# Patient Record
Sex: Male | Born: 1960
Health system: Southern US, Community
[De-identification: ages and names within clinical notes are randomized; demographics above are authoritative.]

## PROBLEM LIST (undated history)

## (undated) ENCOUNTER — Emergency Department (HOSPITAL_COMMUNITY): Disposition: A | Discharge status: Home / Self Care | Payer: 59

## (undated) DIAGNOSIS — G4730 Sleep apnea, unspecified: Secondary | ICD-10-CM

## (undated) DIAGNOSIS — I10 Essential (primary) hypertension: Secondary | ICD-10-CM

## (undated) DIAGNOSIS — Z9289 Personal history of other medical treatment: Secondary | ICD-10-CM

## (undated) DIAGNOSIS — Z8489 Family history of other specified conditions: Secondary | ICD-10-CM

## (undated) DIAGNOSIS — I2510 Atherosclerotic heart disease of native coronary artery without angina pectoris: Secondary | ICD-10-CM

## (undated) DIAGNOSIS — E785 Hyperlipidemia, unspecified: Secondary | ICD-10-CM

## (undated) DIAGNOSIS — N200 Calculus of kidney: Secondary | ICD-10-CM

## (undated) DIAGNOSIS — M1990 Unspecified osteoarthritis, unspecified site: Secondary | ICD-10-CM

## (undated) DIAGNOSIS — Z87442 Personal history of urinary calculi: Secondary | ICD-10-CM

## (undated) HISTORY — DX: Hyperlipidemia, unspecified: E78.5

## (undated) HISTORY — PX: CYSTOSCOPY/RETROGRADE/URETEROSCOPY/STONE EXTRACTION WITH BASKET: SHX5317

## (undated) HISTORY — PX: BACK SURGERY: SHX140

## (undated) HISTORY — DX: Personal history of other medical treatment: Z92.89

## (undated) HISTORY — PX: CARDIAC CATHETERIZATION: SHX172

---
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
## 1997-10-18 ENCOUNTER — Ambulatory Visit (HOSPITAL_COMMUNITY): Admission: RE | Admit: 1997-10-18 | Discharge: 1997-10-18 | Payer: Self-pay | Admitting: Neurosurgery

## 1997-10-25 ENCOUNTER — Ambulatory Visit (HOSPITAL_COMMUNITY): Admission: RE | Admit: 1997-10-25 | Discharge: 1997-10-25 | Payer: Self-pay | Admitting: Neurosurgery

## 1997-12-04 DIAGNOSIS — Z9289 Personal history of other medical treatment: Secondary | ICD-10-CM

## 1997-12-04 HISTORY — DX: Personal history of other medical treatment: Z92.89

## 1998-01-09 ENCOUNTER — Ambulatory Visit (HOSPITAL_COMMUNITY): Admission: RE | Admit: 1998-01-09 | Discharge: 1998-01-09 | Payer: Self-pay | Admitting: Neurosurgery

## 1998-05-07 ENCOUNTER — Encounter: Payer: Self-pay | Admitting: Emergency Medicine

## 1998-05-07 ENCOUNTER — Inpatient Hospital Stay (HOSPITAL_COMMUNITY): Admission: EM | Admit: 1998-05-07 | Discharge: 1998-05-08 | Payer: Self-pay | Admitting: Emergency Medicine

## 1998-05-27 ENCOUNTER — Encounter: Payer: Self-pay | Admitting: Gastroenterology

## 1998-05-27 ENCOUNTER — Ambulatory Visit (HOSPITAL_COMMUNITY): Admission: RE | Admit: 1998-05-27 | Discharge: 1998-05-27 | Payer: Self-pay | Admitting: Gastroenterology

## 1998-06-09 ENCOUNTER — Ambulatory Visit (HOSPITAL_COMMUNITY): Admission: RE | Admit: 1998-06-09 | Discharge: 1998-06-09 | Payer: Self-pay | Admitting: Gastroenterology

## 1998-11-07 ENCOUNTER — Ambulatory Visit (HOSPITAL_COMMUNITY): Admission: RE | Admit: 1998-11-07 | Discharge: 1998-11-07 | Payer: Self-pay | Admitting: Nephrology

## 1998-11-07 ENCOUNTER — Encounter: Payer: Self-pay | Admitting: Nephrology

## 1998-12-12 ENCOUNTER — Ambulatory Visit (HOSPITAL_COMMUNITY): Admission: RE | Admit: 1998-12-12 | Discharge: 1998-12-12 | Payer: Self-pay | Admitting: Cardiology

## 1999-01-17 ENCOUNTER — Ambulatory Visit: Admission: RE | Admit: 1999-01-17 | Discharge: 1999-01-17 | Payer: Self-pay | Admitting: Internal Medicine

## 1999-02-03 ENCOUNTER — Emergency Department (HOSPITAL_COMMUNITY): Admission: EM | Admit: 1999-02-03 | Discharge: 1999-02-03 | Payer: Self-pay | Admitting: Emergency Medicine

## 1999-02-03 ENCOUNTER — Encounter: Payer: Self-pay | Admitting: Emergency Medicine

## 1999-12-10 ENCOUNTER — Emergency Department (HOSPITAL_COMMUNITY): Admission: EM | Admit: 1999-12-10 | Discharge: 1999-12-10 | Payer: Self-pay | Admitting: Emergency Medicine

## 1999-12-10 ENCOUNTER — Encounter: Payer: Self-pay | Admitting: Cardiology

## 2000-01-29 ENCOUNTER — Ambulatory Visit (HOSPITAL_COMMUNITY): Admission: RE | Admit: 2000-01-29 | Discharge: 2000-01-29 | Payer: Self-pay | Admitting: Neurosurgery

## 2000-01-29 ENCOUNTER — Encounter: Payer: Self-pay | Admitting: Neurosurgery

## 2000-05-30 ENCOUNTER — Encounter: Payer: Self-pay | Admitting: Emergency Medicine

## 2000-05-30 ENCOUNTER — Emergency Department (HOSPITAL_COMMUNITY): Admission: EM | Admit: 2000-05-30 | Discharge: 2000-05-30 | Payer: Self-pay | Admitting: Emergency Medicine

## 2000-05-31 ENCOUNTER — Encounter: Admission: RE | Admit: 2000-05-31 | Discharge: 2000-05-31 | Payer: Self-pay | Admitting: Urology

## 2000-05-31 ENCOUNTER — Encounter: Payer: Self-pay | Admitting: Urology

## 2000-06-03 ENCOUNTER — Encounter: Payer: Self-pay | Admitting: Emergency Medicine

## 2000-06-03 ENCOUNTER — Emergency Department (HOSPITAL_COMMUNITY): Admission: EM | Admit: 2000-06-03 | Discharge: 2000-06-03 | Payer: Self-pay | Admitting: Emergency Medicine

## 2000-06-05 ENCOUNTER — Encounter: Payer: Self-pay | Admitting: Urology

## 2000-06-05 ENCOUNTER — Emergency Department (HOSPITAL_COMMUNITY): Admission: EM | Admit: 2000-06-05 | Discharge: 2000-06-05 | Payer: Self-pay | Admitting: Emergency Medicine

## 2000-06-07 ENCOUNTER — Emergency Department (HOSPITAL_COMMUNITY): Admission: EM | Admit: 2000-06-07 | Discharge: 2000-06-07 | Payer: Self-pay | Admitting: Emergency Medicine

## 2000-06-09 ENCOUNTER — Emergency Department (HOSPITAL_COMMUNITY): Admission: EM | Admit: 2000-06-09 | Discharge: 2000-06-10 | Payer: Self-pay | Admitting: Emergency Medicine

## 2000-07-05 HISTORY — PX: ORIF FINGER / THUMB FRACTURE: SUR932

## 2000-07-11 ENCOUNTER — Ambulatory Visit (HOSPITAL_COMMUNITY): Admission: RE | Admit: 2000-07-11 | Discharge: 2000-07-11 | Payer: Self-pay | Admitting: Urology

## 2000-07-11 ENCOUNTER — Encounter: Payer: Self-pay | Admitting: Urology

## 2000-07-13 ENCOUNTER — Encounter: Payer: Self-pay | Admitting: Urology

## 2000-07-13 ENCOUNTER — Encounter: Payer: Self-pay | Admitting: Emergency Medicine

## 2000-07-13 ENCOUNTER — Ambulatory Visit (HOSPITAL_COMMUNITY): Admission: EM | Admit: 2000-07-13 | Discharge: 2000-07-13 | Payer: Self-pay | Admitting: Emergency Medicine

## 2000-08-01 ENCOUNTER — Encounter: Payer: Self-pay | Admitting: Urology

## 2000-08-01 ENCOUNTER — Encounter: Admission: RE | Admit: 2000-08-01 | Discharge: 2000-08-01 | Payer: Self-pay | Admitting: Urology

## 2002-08-14 ENCOUNTER — Emergency Department (HOSPITAL_COMMUNITY): Admission: EM | Admit: 2002-08-14 | Discharge: 2002-08-14 | Payer: Self-pay | Admitting: Emergency Medicine

## 2002-08-14 ENCOUNTER — Encounter: Payer: Self-pay | Admitting: Emergency Medicine

## 2004-04-21 ENCOUNTER — Emergency Department (HOSPITAL_COMMUNITY): Admission: EM | Admit: 2004-04-21 | Discharge: 2004-04-21 | Payer: Self-pay | Admitting: Emergency Medicine

## 2004-07-05 HISTORY — PX: KIDNEY STONE SURGERY: SHX686

## 2005-06-28 ENCOUNTER — Emergency Department (HOSPITAL_COMMUNITY): Admission: EM | Admit: 2005-06-28 | Discharge: 2005-06-28 | Payer: Self-pay | Admitting: *Deleted

## 2005-07-10 ENCOUNTER — Emergency Department (HOSPITAL_COMMUNITY): Admission: EM | Admit: 2005-07-10 | Discharge: 2005-07-10 | Payer: Self-pay | Admitting: Emergency Medicine

## 2007-01-03 HISTORY — PX: SHOULDER ARTHROSCOPY W/ SUBACROMIAL DECOMPRESSION AND DISTAL CLAVICLE EXCISION: SHX2401

## 2007-01-17 ENCOUNTER — Observation Stay (HOSPITAL_COMMUNITY): Admission: EM | Admit: 2007-01-17 | Discharge: 2007-01-18 | Payer: Self-pay | Admitting: Orthopedic Surgery

## 2007-01-17 ENCOUNTER — Encounter: Payer: Self-pay | Admitting: Orthopedic Surgery

## 2007-01-23 ENCOUNTER — Encounter: Admission: RE | Admit: 2007-01-23 | Discharge: 2007-04-23 | Payer: Self-pay | Admitting: Orthopedic Surgery

## 2007-11-22 ENCOUNTER — Emergency Department (HOSPITAL_COMMUNITY): Admission: EM | Admit: 2007-11-22 | Discharge: 2007-11-22 | Payer: Self-pay | Admitting: Emergency Medicine

## 2009-05-20 DIAGNOSIS — Z9289 Personal history of other medical treatment: Secondary | ICD-10-CM

## 2009-05-20 HISTORY — DX: Personal history of other medical treatment: Z92.89

## 2009-06-02 ENCOUNTER — Emergency Department (HOSPITAL_BASED_OUTPATIENT_CLINIC_OR_DEPARTMENT_OTHER): Admission: EM | Admit: 2009-06-02 | Discharge: 2009-06-02 | Payer: Self-pay | Admitting: Emergency Medicine

## 2009-06-02 ENCOUNTER — Ambulatory Visit: Payer: Self-pay | Admitting: Diagnostic Radiology

## 2009-10-23 ENCOUNTER — Emergency Department (HOSPITAL_BASED_OUTPATIENT_CLINIC_OR_DEPARTMENT_OTHER): Admission: EM | Admit: 2009-10-23 | Discharge: 2009-10-23 | Payer: Self-pay | Admitting: Emergency Medicine

## 2010-06-17 ENCOUNTER — Encounter
Admission: RE | Admit: 2010-06-17 | Discharge: 2010-06-17 | Payer: Self-pay | Source: Home / Self Care | Attending: Neurology | Admitting: Neurology

## 2010-10-07 LAB — URINE CULTURE
Colony Count: NO GROWTH
Culture: NO GROWTH

## 2010-10-07 LAB — URINE MICROSCOPIC-ADD ON

## 2010-10-07 LAB — CBC
HCT: 44.6 % (ref 39.0–52.0)
Hemoglobin: 15 g/dL (ref 13.0–17.0)
MCHC: 33.7 g/dL (ref 30.0–36.0)
MCV: 89.2 fL (ref 78.0–100.0)
Platelets: 218 10*3/uL (ref 150–400)
RBC: 5 MIL/uL (ref 4.22–5.81)
RDW: 13 % (ref 11.5–15.5)
WBC: 6.3 K/uL (ref 4.0–10.5)

## 2010-10-07 LAB — URINALYSIS, ROUTINE W REFLEX MICROSCOPIC
Bilirubin Urine: NEGATIVE
Glucose, UA: NEGATIVE mg/dL
Ketones, ur: 15 mg/dL — AB
Nitrite: NEGATIVE
Protein, ur: NEGATIVE mg/dL
Specific Gravity, Urine: 1.016 (ref 1.005–1.030)
Urobilinogen, UA: 0.2 mg/dL (ref 0.0–1.0)
pH: 5.5 (ref 5.0–8.0)

## 2010-10-07 LAB — DIFFERENTIAL
Basophils Absolute: 0.1 K/uL (ref 0.0–0.1)
Basophils Relative: 2 % — ABNORMAL HIGH (ref 0–1)
Eosinophils Absolute: 0.2 10*3/uL (ref 0.0–0.7)
Eosinophils Relative: 3 % (ref 0–5)
Lymphocytes Relative: 36 % (ref 12–46)
Lymphs Abs: 2.3 K/uL (ref 0.7–4.0)
Monocytes Absolute: 0.6 10*3/uL (ref 0.1–1.0)
Monocytes Relative: 9 % (ref 3–12)
Neutro Abs: 3.1 K/uL (ref 1.7–7.7)
Neutrophils Relative %: 51 % (ref 43–77)

## 2010-10-07 LAB — COMPREHENSIVE METABOLIC PANEL
ALT: 33 U/L (ref 0–53)
Albumin: 4.4 g/dL (ref 3.5–5.2)
Alkaline Phosphatase: 100 U/L (ref 39–117)
Glucose, Bld: 98 mg/dL (ref 70–99)
Potassium: 5 mEq/L (ref 3.5–5.1)
Sodium: 143 mEq/L (ref 135–145)
Total Protein: 7.4 g/dL (ref 6.0–8.3)

## 2010-10-07 LAB — COMPREHENSIVE METABOLIC PANEL WITH GFR
AST: 45 U/L — ABNORMAL HIGH (ref 0–37)
BUN: 17 mg/dL (ref 6–23)
CO2: 22 meq/L (ref 19–32)
Calcium: 9.1 mg/dL (ref 8.4–10.5)
Chloride: 109 meq/L (ref 96–112)
Creatinine, Ser: 0.9 mg/dL (ref 0.4–1.5)
GFR calc Af Amer: >60 mL/min (ref >60)
GFR calc non Af Amer: >60 mL/min (ref >60)
Total Bilirubin: 0.8 mg/dL (ref 0.3–1.2)

## 2010-10-07 LAB — LIPASE, BLOOD: Lipase: 99 U/L (ref 23–300)

## 2010-11-17 NOTE — Op Note (Signed)
NAME:  Steven Henson, Steven Henson              ACCOUNT NO.:  [id number]   MEDICAL RECORD NO.:  [id number]          PATIENT TYPE:  AMB   LOCATION:  DAY                          FACILITY:  [facility]   PHYSICIAN:  Dionne Ano. Gramig III, M.D.DATE OF BIRTH:  06/01/1961   DATE OF PROCEDURE:  01/17/2007  DATE OF DISCHARGE:                               OPERATIVE REPORT   PREOPERATIVE DIAGNOSES:  1. Impingement syndrome left shoulder, rule out rotator cuff tear with      notable type 2 acromion morphology and mild degenerative changes      about the acromioclavicular joint and notable failure of      conservative management of the condition.  2. Ulnar nerve compression at the elbow.   SURGICAL PROCEDURE PERFORMED:  1. Evaluation under anesthesia, left shoulder.  2. Arthroscopy left shoulder with subacromial decompression and      bursectomy.  3. Arthroscopic distal clavicle resection.  4. Arthroscopy left shoulder with glenohumeral joint evaluation and      notable intact rotator cuff.  5. Ulnar nerve release at the elbow (in situ).   SURGEON:  Dionne Ano. Amanda Pea, M.D.   ASSISTANT:  Karie Chimera, P.A.-C.   COMPLICATIONS:  None.   ANESTHESIA:  General.   TOURNIQUET TIME:  Less than 40 minutes for the ulnar nerve release.   INDICATIONS FOR PROCEDURE:  This patient is a pleasant male presents  above-mentioned diagnosis.  He has failed conservative management and  desires to proceed with the above-mentioned operative intervention.  He  understands the risks of bleeding, infection, anesthesia, stiffness,  dystrophy and other various imponderables associated with the procedure  itself which had been extensively detailed to him.   OPERATION IN DETAIL:  The patient seen by myself and anesthesia, Ancef  was given preoperatively.  The patient was consented, permit was signed.  Arm marked and he was taken to the operative suite and underwent general  anesthesia under direction of Dr. Council Mechanic.  After  smooth induction he  was placed a beach-chair position.   Following this glenohumeral evaluation and evaluation under anesthesia  revealed excellent stability about shoulder.  There was no gross  insufficiency of the anterior and posterior labral regions noted on  clinical exam.  There was no crepitus about the glenohumeral joint with  shift and load test.   Following this sterile prep and drape ensued.  Once sterile field was  secured posterolateral portal was made followed by an anterior and  lateral portal during the course of the operation.  The glenohumeral  joint was entered and following this, the biceps tendon and its anchor  looked excellent.  The anterior posterior labrum appeared to be normal.  There was no loose bodies in the glenohumeral joint.  The articular  surface looked excellent and the rotator cuff did not have any  significant tearing.  This area was thoroughly evaluated and  arthroscopic pictures were taken for permanent documentation.   Following this, I then performed placement of the scope and subacromial  space and performed a subacromial decompression and bursectomy.  I  should note the patient had a very  robust and adherent bursa.  This took  a good deal of time to take down and remove.  This done so without  difficulty, removing adhesions.  I did perform release of the  coracoacromial ligament as it was very dense adherent and somewhat  impinging against the rotator cuff interval region.  Following this  shaver and bur were used to perform the subacromial decompression and  convert him to a type 1 acromion morphology.   Following this, I then placed the scope more medial, identified the [facility]  joint, performed arthroscopic distal clavicle resection with bur shaver  and thermal ablator.   This was done through a working anterior portal.   Thus, the patient underwent subacromial decompression, bursectomy and  distal clavicle excision through the scope.  He  tolerated this well and  no complicating features.   Following this, I closed the wounds with Prolene, placed sterile bandage  and then placed him in a supine position.  The arm was then padded,  tourniquet was applied and insufflated after thorough prep and drape.  Once this was done, the patient underwent incision posterior medially.  Dissection was carried down.  The patient underwent release of the  medial intermuscular septum, arcade of Struthers, distal portion as well  as the cubital tunnel, Osborne's ligament and a two heads of the FCU  both superficial and deep.  The patient tolerated this well.  Arm was  placed through a full range of motion.  There was no subluxation of the  nerve.  I was pleased with the findings.  Following this, I then  performed a very copious irrigation of the region followed by closure  with Vicryl and Prolene.  The patient tolerated this well and there were  no complicating features.   The patient's family and I have discussed all issues.  Sterile dressing  was applied to the medial elbow.   He will be monitored overnight due to sleep apnea issues.  In addition  to this, I have discussed with he and his family we will begin therapy  next week and see him Friday for wound check.  The patient be given  additional gram of Ancef and the postop recovery care region and will be  monitored with full sleep apnea precautions.  It is a pleasure to  participate in his care and I look forward to participating in his  operative recovery.           ______________________________  Dionne Ano. Everlene Other, M.D.     Nash Mantis  D:  01/17/2007  T:  01/18/2007  Job:  [id number]

## 2010-11-20 NOTE — Op Note (Signed)
[facility]  Patient:    Steven Henson, Steven Henson                     MRN: [id number] Proc. Date: 07/13/00 Adm. Date:  [id number] Disc. Date: [id number] Attending:  Benny Lennert                           Operative Report  PREOPERATIVE DIAGNOSIS:  Left ureteral calculi.  POSTOPERATIVE DIAGNOSIS:  Left ureteral calculi.  OPERATION:  Cystoscopy, ureteroscopy, balloon dilation of the ureter, stone basket and double-J stent placement.  SURGEON:  Barron Alvine, M.D.  ANESTHESIA:  General.  INDICATIONS:  Mr. Steven Henson is a 50 year old male.  He underwent lithotripsy 48 hours ago for an 8-10 mm stone in his left proximal ureter.  He presents with excruciating flank discomfort.  Imaging studies have revealed a group of calculi within the distal ureter causing obstruction.  He presents now for removal of those calculi/fragments.  DESCRIPTION OF PROCEDURE:   The patient was taken to the operating room where he had the successful induction of general anesthesia.  He was placed in the lithotomy position, prepped and draped in the usual manner.  Cystoscopy revealed an unremarkable bladder with the exception of few small stone fragments.  Retrograde pyelogram confirmed the presence of the stones  A guidewire was able to be passed beyond the stone fragments into the renal pelvis with fluoroscopic guidance.  We were able to engage the ureter reasonably easily but as we got up 1-2 cm, the ureter narrowed significantly. We were unable to pass the scope and for that reason we went ahead and performed balloon dilation of the distal ureter in a standard manner with a 4 cm balloon.  At the completion of balloon dilation we were able to advance the ureteroscope up to the fragments.  Multiple left distal ureteral stone fragments were basket extracted.  No additional fragments were appreciated on repeat uteroscopy.  Over the guide wire a 7 French x 27 cm double-J stent was placed  for fluoroscopic guidance.  The patient appeared to tolerate the operation well.  There were no obvious complications. DD:  08/07/00 TD:  08/08/00 Job: [id number]

## 2010-11-20 NOTE — Op Note (Signed)
[place]  Patient:    Steven Henson, Steven Henson                     MRN: [id number] Proc. Date: 06/05/00 Adm. Date:  [id number] Attending:  Lauree Chandler CC:         Thereasa Solo. Little, M.D.   Operative Report  PREOPERATIVE DIAGNOSIS:  Severe right recurrent flank pain, due to right distal ureteral calculus.  POSTOPERATIVE DIAGNOSIS:  Severe right recurrent flank pain, due to right distal ureteral calculus.  PROCEDURE:  Cystoscopy, right ureteroscopy, urethroscopic stone extraction, right retrograde pyelogram with interpretation, and insertion of a right double-J catheter.  SURGEON:  Maretta Bees. Vonita Moss, M.D.  ANESTHESIA:  General.  INDICATIONS:  This 50 year old gentleman has a 4.0 mm stone in the distal right ureter that has caused several days of recurrent right pain, and he wishes to have this stone removed.  DESCRIPTION OF PROCEDURE:  The patient is brought to the operating room and placed in the lithotomy position.  The external genitalia were prepped and draped in the usual fashion.  He was cystoscoped, and the urethra, prostate, and bladder were unremarkable.  I passed a guide wire up under fluoroscopic control, past the distal ureteral stone.  I then did a balloon dilatation with the 4.0 cm balloon dilating unit.  I dilated the intramural ureter for three minutes.  I then used a 6-French short rigid ureteroscope, and with the guide wire in place, encountered a light yellow irregular stone just beyond the intramural ureter.  I then used the [facility] stone basket, and removed this stone intact.  The stone was given to his family.  At this point, a right retrograde pyelogram was performed, showing the nonobstructed pyelocaliceal system.  With contrast in place, I inserted a 6-French 26.0 cm double-J catheter over the guide wire, and the proximal end coiled in the renal pelvis, and the distal end coiled in the bladder, with the string  brought out per the urethra and taped to the penis.  He was taken to the recovery room in good condition, having tolerated the procedure well. DD:  06/05/00 TD:  06/05/00 Job: [id number]

## 2011-04-13 ENCOUNTER — Encounter (INDEPENDENT_AMBULATORY_CARE_PROVIDER_SITE_OTHER): Payer: Self-pay | Admitting: General Surgery

## 2011-04-13 ENCOUNTER — Ambulatory Visit (INDEPENDENT_AMBULATORY_CARE_PROVIDER_SITE_OTHER): Payer: 59 | Admitting: General Surgery

## 2011-04-13 VITALS — BP 142/102 | HR 96 | Temp 97.8°F | Resp 20 | Ht 70.0 in | Wt 218.8 lb

## 2011-04-13 DIAGNOSIS — D171 Benign lipomatous neoplasm of skin and subcutaneous tissue of trunk: Secondary | ICD-10-CM | POA: Insufficient documentation

## 2011-04-13 DIAGNOSIS — D1779 Benign lipomatous neoplasm of other sites: Secondary | ICD-10-CM

## 2011-04-13 NOTE — Progress Notes (Signed)
HPI The patient comes in with a chief complaint of a large growth on the upper portion of his right back. It is felt to be a lipoma.  PE On examination this appears to be a 6 x 7 cm lipoma of the right upper back in the upper portion of the trapezius muscle. Believe that this is supple muscular or underneath the fascia.  Study review There are no studies to review.  Assessment 7 cm lipoma likely subfascial in the right upper back  Plan The plan is to perform an excision under local and/or monitored anesthesia care. This will be an outpatient procedure.

## 2011-04-19 LAB — PROTIME-INR
INR: 1
Prothrombin Time: 12.8

## 2011-04-19 LAB — HEMOGLOBIN AND HEMATOCRIT, BLOOD: Hemoglobin: 14.3

## 2011-05-10 ENCOUNTER — Encounter (HOSPITAL_BASED_OUTPATIENT_CLINIC_OR_DEPARTMENT_OTHER): Payer: Self-pay | Admitting: *Deleted

## 2011-05-10 NOTE — Progress Notes (Signed)
Pt's cardiologist - Dr. Caprice Kluver - sees him regularly every 18 mos.  Office note, EKG requested from office. [phone]

## 2011-05-14 ENCOUNTER — Other Ambulatory Visit (INDEPENDENT_AMBULATORY_CARE_PROVIDER_SITE_OTHER): Payer: Self-pay | Admitting: General Surgery

## 2011-05-14 ENCOUNTER — Encounter (HOSPITAL_BASED_OUTPATIENT_CLINIC_OR_DEPARTMENT_OTHER)
Admission: RE | Disposition: A | Discharge status: Home / Self Care | Payer: Self-pay | Source: Ambulatory Visit | Attending: General Surgery

## 2011-05-14 ENCOUNTER — Encounter (HOSPITAL_BASED_OUTPATIENT_CLINIC_OR_DEPARTMENT_OTHER): Payer: Self-pay | Admitting: Anesthesiology

## 2011-05-14 ENCOUNTER — Encounter (HOSPITAL_BASED_OUTPATIENT_CLINIC_OR_DEPARTMENT_OTHER): Payer: Self-pay | Admitting: *Deleted

## 2011-05-14 ENCOUNTER — Ambulatory Visit (HOSPITAL_BASED_OUTPATIENT_CLINIC_OR_DEPARTMENT_OTHER)
Admission: RE | Admit: 2011-05-14 | Discharge: 2011-05-14 | Disposition: A | Discharge status: Home / Self Care | Payer: 59 | Source: Ambulatory Visit | Attending: General Surgery | Admitting: General Surgery

## 2011-05-14 ENCOUNTER — Ambulatory Visit (HOSPITAL_BASED_OUTPATIENT_CLINIC_OR_DEPARTMENT_OTHER): Payer: 59 | Admitting: Anesthesiology

## 2011-05-14 DIAGNOSIS — D1739 Benign lipomatous neoplasm of skin and subcutaneous tissue of other sites: Secondary | ICD-10-CM | POA: Insufficient documentation

## 2011-05-14 DIAGNOSIS — F17200 Nicotine dependence, unspecified, uncomplicated: Secondary | ICD-10-CM | POA: Insufficient documentation

## 2011-05-14 DIAGNOSIS — G4733 Obstructive sleep apnea (adult) (pediatric): Secondary | ICD-10-CM | POA: Insufficient documentation

## 2011-05-14 HISTORY — PX: LIPOMA EXCISION: SHX5283

## 2011-05-14 HISTORY — DX: Unspecified osteoarthritis, unspecified site: M19.90

## 2011-05-14 HISTORY — DX: Calculus of kidney: N20.0

## 2011-05-14 HISTORY — DX: Sleep apnea, unspecified: G47.30

## 2011-05-14 SURGERY — EXCISION LIPOMA
Anesthesia: General | Site: Back | Laterality: Right | Wound class: Clean

## 2011-05-14 MED ORDER — SUCCINYLCHOLINE CHLORIDE 20 MG/ML IJ SOLN
INTRAMUSCULAR | Status: DC | PRN
  Administered 2011-05-14: 120 mg via INTRAVENOUS

## 2011-05-14 MED ORDER — DEXAMETHASONE SODIUM PHOSPHATE 4 MG/ML IJ SOLN
INTRAMUSCULAR | Status: DC | PRN
  Administered 2011-05-14: 10 mg via INTRAVENOUS

## 2011-05-14 MED ORDER — LIDOCAINE HCL (CARDIAC) 20 MG/ML IV SOLN
INTRAVENOUS | Status: DC | PRN
  Administered 2011-05-14: 100 mg via INTRAVENOUS

## 2011-05-14 MED ORDER — MIDAZOLAM HCL 5 MG/5ML IJ SOLN
INTRAMUSCULAR | Status: DC | PRN
  Administered 2011-05-14: 2 mg via INTRAVENOUS

## 2011-05-14 MED ORDER — LACTATED RINGERS IV SOLN
INTRAVENOUS | Status: DC
  Administered 2011-05-14 (×2): via INTRAVENOUS

## 2011-05-14 MED ORDER — SODIUM CHLORIDE 0.9 % IR SOLN
Status: DC | PRN
  Administered 2011-05-14: 200 mL

## 2011-05-14 MED ORDER — FENTANYL CITRATE 0.05 MG/ML IJ SOLN
INTRAMUSCULAR | Status: DC | PRN
  Administered 2011-05-14 (×2): 100 ug via INTRAVENOUS

## 2011-05-14 MED ORDER — BUPIVACAINE-EPINEPHRINE 0.25% -1:200000 IJ SOLN
INTRAMUSCULAR | Status: DC | PRN
  Administered 2011-05-14: 14 mL

## 2011-05-14 MED ORDER — PROMETHAZINE HCL 25 MG/ML IJ SOLN
12.5000 mg | Freq: Once | INTRAMUSCULAR | Status: DC | PRN
Start: 2011-05-14 — End: 2011-05-14

## 2011-05-14 MED ORDER — PROPOFOL 10 MG/ML IV EMUL
INTRAVENOUS | Status: DC | PRN
  Administered 2011-05-14: 50 mg via INTRAVENOUS
  Administered 2011-05-14: 200 mg via INTRAVENOUS

## 2011-05-14 MED ORDER — HYDROMORPHONE HCL PF 1 MG/ML IJ SOLN: 0.2500 mg | INTRAMUSCULAR | Status: DC | PRN

## 2011-05-14 MED ORDER — ONDANSETRON HCL 4 MG/2ML IJ SOLN
INTRAMUSCULAR | Status: DC | PRN
  Administered 2011-05-14: 4 mg via INTRAVENOUS

## 2011-05-14 MED ORDER — HYDROCODONE-ACETAMINOPHEN 5-500 MG PO TABS: 1.0000 | ORAL_TABLET | Freq: Four times a day (QID) | ORAL | Status: AC | PRN

## 2011-05-14 SURGICAL SUPPLY — 56 items
ADH SKN CLS APL DERMABOND .7 (GAUZE/BANDAGES/DRESSINGS) ×1
APL SKNCLS STERI-STRIP NONHPOA (GAUZE/BANDAGES/DRESSINGS)
BANDAGE GAUZE ELAST BULKY 4 IN (GAUZE/BANDAGES/DRESSINGS) IMPLANT
BENZOIN TINCTURE PRP APPL 2/3 (GAUZE/BANDAGES/DRESSINGS) IMPLANT
BLADE SURG 15 STRL LF DISP TIS (BLADE) ×1 IMPLANT
BLADE SURG 15 STRL SS (BLADE) ×2
BLADE SURG ROTATE 9660 (MISCELLANEOUS) IMPLANT
BNDG ADH 5X4 AIR PERM ELC (GAUZE/BANDAGES/DRESSINGS)
BNDG COHESIVE 4X5 WHT NS (GAUZE/BANDAGES/DRESSINGS) IMPLANT
CANISTER SUCTION 1200CC (MISCELLANEOUS) ×1 IMPLANT
CHLORAPREP W/TINT 26ML (MISCELLANEOUS) ×2 IMPLANT
CLEANER CAUTERY TIP 5X5 PAD (MISCELLANEOUS) ×1 IMPLANT
CLOTH BEACON ORANGE TIMEOUT ST (SAFETY) ×2 IMPLANT
COVER MAYO STAND STRL (DRAPES) ×2 IMPLANT
COVER TABLE BACK 60X90 (DRAPES) ×2 IMPLANT
DECANTER SPIKE VIAL GLASS SM (MISCELLANEOUS) IMPLANT
DERMABOND ADVANCED (GAUZE/BANDAGES/DRESSINGS) ×1
DERMABOND ADVANCED .7 DNX12 (GAUZE/BANDAGES/DRESSINGS) IMPLANT
DRAPE LAPAROTOMY T 102X78X121 (DRAPES) ×2 IMPLANT
DRAPE UTILITY XL STRL (DRAPES) ×2 IMPLANT
DRSG TEGADERM 4X4.75 (GAUZE/BANDAGES/DRESSINGS) ×2 IMPLANT
ELECT REM PT RETURN 9FT ADLT (ELECTROSURGICAL) ×2
ELECTRODE REM PT RTRN 9FT ADLT (ELECTROSURGICAL) ×1 IMPLANT
GLOVE BIO SURGEON STRL SZ 6.5 (GLOVE) ×1 IMPLANT
GLOVE BIOGEL PI IND STRL 6.5 (GLOVE) IMPLANT
GLOVE BIOGEL PI IND STRL 8 (GLOVE) ×1 IMPLANT
GLOVE BIOGEL PI INDICATOR 6.5 (GLOVE) ×1
GLOVE BIOGEL PI INDICATOR 8 (GLOVE) ×1
GLOVE ECLIPSE 7.5 STRL STRAW (GLOVE) ×3 IMPLANT
GOWN PREVENTION PLUS XLARGE (GOWN DISPOSABLE) ×2 IMPLANT
NDL HYPO 25X1 1.5 SAFETY (NEEDLE) ×1 IMPLANT
NEEDLE HYPO 25X1 1.5 SAFETY (NEEDLE) ×2 IMPLANT
NS IRRIG 1000ML POUR BTL (IV SOLUTION) ×2 IMPLANT
PACK BASIN DAY SURGERY FS (CUSTOM PROCEDURE TRAY) ×2 IMPLANT
PAD CLEANER CAUTERY TIP 5X5 (MISCELLANEOUS) ×1
PENCIL BUTTON HOLSTER BLD 10FT (ELECTRODE) ×2 IMPLANT
SHEET MEDIUM DRAPE 40X70 STRL (DRAPES) IMPLANT
STRIP CLOSURE SKIN 1/2X4 (GAUZE/BANDAGES/DRESSINGS) ×1 IMPLANT
STRIP CLOSURE SKIN 1/4X4 (GAUZE/BANDAGES/DRESSINGS) IMPLANT
SUCTION FRAZIER TIP 10 FR DISP (SUCTIONS) IMPLANT
SUT ETHILON 4 0 PS 2 18 (SUTURE) ×1 IMPLANT
SUT MON AB 4-0 PC3 18 (SUTURE) ×2 IMPLANT
SUT PROLENE 4 0 PS 2 18 (SUTURE) IMPLANT
SUT VIC AB 2-0 SH 27 (SUTURE)
SUT VIC AB 2-0 SH 27XBRD (SUTURE) ×2 IMPLANT
SUT VIC AB 3-0 SH 27 (SUTURE) ×2
SUT VIC AB 3-0 SH 27X BRD (SUTURE) IMPLANT
SUT VIC AB 4-0 SH 27 (SUTURE)
SUT VIC AB 4-0 SH 27XANBCTRL (SUTURE) IMPLANT
SYR BULB 3OZ (MISCELLANEOUS) ×1 IMPLANT
SYR CONTROL 10ML LL (SYRINGE) ×2 IMPLANT
TOWEL OR 17X24 6PK STRL BLUE (TOWEL DISPOSABLE) ×4 IMPLANT
TOWEL OR NON WOVEN STRL DISP B (DISPOSABLE) ×2 IMPLANT
TUBE CONNECTING 20X1/4 (TUBING) ×1 IMPLANT
WATER STERILE IRR 1000ML POUR (IV SOLUTION) ×2 IMPLANT
YANKAUER SUCT BULB TIP NO VENT (SUCTIONS) ×1 IMPLANT

## 2011-05-14 NOTE — Anesthesia Postprocedure Evaluation (Signed)
  Anesthesia Post-op Note  Patient: Steven Henson  Procedure(s) Performed:  EXCISION LIPOMA - EXCISION RIGHT UPPER BACK LIPOMA  Patient Location: PACU  Anesthesia Type: General  Level of Consciousness: awake, alert  and oriented  Airway and Oxygen Therapy: Patient Spontanous Breathing  Post-op Pain: mild  Post-op Assessment: Post-op Vital signs reviewed, Patient's Cardiovascular Status Stable, Respiratory Function Stable, Patent Airway, No signs of Nausea or vomiting, Adequate PO intake and Pain level controlled  Post-op Vital Signs: stable  Complications: No apparent anesthesia complications

## 2011-05-14 NOTE — Transfer of Care (Signed)
Immediate Anesthesia Transfer of Care Note  Patient: Steven Henson  Procedure(s) Performed:  EXCISION LIPOMA - EXCISION RIGHT UPPER BACK LIPOMA  Patient Location: PACU  Anesthesia Type: General  Level of Consciousness: awake and patient cooperative  Airway & Oxygen Therapy: Patient Spontanous Breathing and Patient connected to face mask  Post-op Assessment: Report given to PACU RN and Post -op Vital signs reviewed and stable  Post vital signs: Reviewed and stable  Complications: No apparent anesthesia complications

## 2011-05-14 NOTE — Op Note (Signed)
   05/14/2011  1:57 PM  PATIENT:  Andres Shad  50 y.o. male  PRE-OPERATIVE DIAGNOSIS:  LIPOMA RIGHT UPPER BACK  POST-OPERATIVE DIAGNOSIS:  lipoma right upper back  PROCEDURE:  Procedure(s): EXCISION LIPOMA  SURGEON:  Surgeon(s): Cherylynn Ridges III, MD  PHYSICIAN ASSISTANT:   ASSISTANTS: none   ANESTHESIA:   local and general  EBL:  Total I/O In: 1000 [I.V.:1000] Out: -   BLOOD ADMINISTERED:none  DRAINS: none   SPECIMEN:  Source of Specimen:  right upper back lipoma  DISPOSITION OF SPECIMEN:  PATHOLOGY  COUNTS:  YES  DICTATION: .Other Dictation: Dictation Number [id number]  PATIENT DISPOSITION:  PACU - hemodynamically stable.   Delay start of Pharmacological VTE agent (>24hrs) due to surgical blood loss or risk of bleeding:  not applicable  Raylan Troiani III,Scottie Metayer O 11/9/20121:57 PM

## 2011-05-14 NOTE — H&P (Signed)
Diagnoses     Lipoma of back   - Primary    214.8      Reason for Visit     Other    new pt- eval possible lipoma on back        Vitals - Last Recorded       BP Pulse Temp(Src) Resp Ht Wt    142/102  96  97.8 F (36.6 C) (Temporal)  20  5\' 10"  (1.778 m)  99.247 kg (218 lb 12.8 oz)          BMI              31.39 kg/m2                 Progress Notes     Shahira Fiske Rene Kocher, MD  04/13/2011  3:24 PM  Signed HPI The patient comes in with a chief complaint of a large growth on the upper portion of his right back. It is felt to be a lipoma.   PE On examination this appears to be a 6 x 7 cm lipoma of the right upper back in the upper portion of the trapezius muscle. Believe that this is supple muscular or underneath the fascia.   Study review There are no studies to review.   Assessment 7 cm lipoma likely subfascial in the right upper back   Plan The plan is to perform an excision under local and/or monitored anesthesia care. This will be an outpatient procedure.      Marta Lamas. Gae Bon, MD, FACS [phone] [facility]

## 2011-05-14 NOTE — Anesthesia Preprocedure Evaluation (Signed)
Anesthesia Evaluation  Patient identified by MRN, date of birth, ID band Patient awake    Reviewed: Allergy & Precautions, H&P , NPO status , Patient's Chart, lab work & pertinent test results  Airway Mallampati: I TM Distance: >3 FB Neck ROM: Full    Dental  (+) Partial Upper   Pulmonary sleep apnea , Current Smoker,  clear to auscultation        Cardiovascular Regular Normal    Neuro/Psych    GI/Hepatic   Endo/Other    Renal/GU      Musculoskeletal   Abdominal   Peds  Hematology   Anesthesia Other Findings   Reproductive/Obstetrics                           Anesthesia Physical Anesthesia Plan  ASA: II  Anesthesia Plan: General   Post-op Pain Management:    Induction:   Airway Management Planned: Oral ETT  Additional Equipment:   Intra-op Plan:   Post-operative Plan: Extubation in OR  Informed Consent: I have reviewed the patients History and Physical, chart, labs and discussed the procedure including the risks, benefits and alternatives for the proposed anesthesia with the patient or authorized representative who has indicated his/her understanding and acceptance.   Dental advisory given  Plan Discussed with: CRNA and Surgeon  Anesthesia Plan Comments:         Anesthesia Quick Evaluation

## 2011-05-14 NOTE — Op Note (Signed)
NAME:  DORRANCE, SELLICK NO.:  [id number]  MEDICAL RECORD NO.:  [id number]  LOCATION:                                 FACILITY:  PHYSICIAN:  Cherylynn Ridges, M.D.         DATE OF BIRTH:  DATE OF PROCEDURE:  05/14/2011 DATE OF DISCHARGE:                              OPERATIVE REPORT   PREOPERATIVE DIAGNOSIS:  Lipoma of the right upper back.  POSTOPERATIVE DIAGNOSIS:  4 x 6 cm right lipoma subcutaneous tissue of the right upper back.  PROCEDURE:  Excision of lipoma of right upper back.  SURGEON:  Cherylynn Ridges, MD  ANESTHESIA:  General endotracheal.  ESTIMATED BLOOD LOSS:  Less than 20 mL.  COMPLICATIONS:  No complications.  CONDITION:  Stable.  SPECIMEN:  Lipoma excised from the subcutaneous tissue of the right upper back.  INDICATION FOR OPERATION:  The patient is a 50 year old male with a symptomatic lump underneath the skin of the right upper back who now comes in for excision.  OPERATION:  The patient was taken to the operating room, placed on table initially in a supine position.  After an adequate endotracheal anesthetic was administered, he was placed into the prone position, then prepped and draped in usual sterile manner exposing the area of the right upper back.  After a proper time-out was performed identifying the patient and the procedure to be performed and the proper side, we made an incision in the midportion of the lipoma diagonally along the right upper back into the subcutaneous tissue.  We obtained hemostasis with electrocautery. We dissected down into the subcutaneous plane and then once we got in that plane in the supra fascia plain was a large lipoma with multiple interdigitations, which had to be dissected away from the subcutaneous and surrounding tissue.  It was above the muscle fascia of the latissimus and dorsi muscle and of the trapezius.  Once we had it completely excised, we obtained hemostasis with electrocautery.  We  reapproximated the deep layer using interrupted 3-0 Vicryl sutures.  We injected 0.25% Marcaine with epi into the wound. Total of about 20 mL used.  We then reapproximated the skin using running subcuticular stitch of 4-0 Monocryl.  All needle counts, sponge counts, and instrument counts were correct.  Dermabond, Steri-Strips, and Tegaderm were used to complete the dressing.     Cherylynn Ridges, M.D.     JOW/MEDQ  D:  05/14/2011  T:  05/14/2011  Job:  [id number]

## 2011-05-14 NOTE — Anesthesia Procedure Notes (Signed)
Procedure Name: Intubation Date/Time: 05/14/2011 1:02 PM Performed by: Signa Kell Pre-anesthesia Checklist: Patient identified, Emergency Drugs available, Suction available and Patient being monitored Patient Re-evaluated:Patient Re-evaluated prior to inductionOxygen Delivery Method: Circle System Utilized Preoxygenation: Pre-oxygenation with 100% oxygen Intubation Type: IV induction Ventilation: Mask ventilation without difficulty Laryngoscope Size: Mac and 4 Tube type: Oral Tube size: 8.0 mm Number of attempts: 1 Airway Equipment and Method: stylet and oral airway Placement Confirmation: ETT inserted through vocal cords under direct vision,  positive ETCO2 and breath sounds checked- equal and bilateral Secured at: 23 cm Tube secured with: Tape Dental Injury: Teeth and Oropharynx as per pre-operative assessment

## 2011-05-14 NOTE — Interval H&P Note (Signed)
History and Physical Interval Note:   05/14/2011   12:47 PM   Steven Henson  has presented today for surgery, with the diagnosis of LIPOMA RIGHT UPPER BACK  The various methods of treatment have been discussed with the patient and family. After consideration of risks, benefits and other options for treatment, the patient has consented to  Procedure(s): EXCISION LIPOMA as a surgical intervention .  The patients' history has been reviewed, patient examined, no change in status, stable for surgery.  I have reviewed the patients' chart and labs.  Questions were answered to the patient's satisfaction.     Steven Fjeld Rene Kocher  MD

## 2011-05-17 ENCOUNTER — Telehealth (INDEPENDENT_AMBULATORY_CARE_PROVIDER_SITE_OTHER): Payer: Self-pay | Admitting: General Surgery

## 2011-05-17 NOTE — Telephone Encounter (Signed)
Patient needs 10day po for lipoma removal on 05/14/11, please call.

## 2011-05-24 ENCOUNTER — Encounter (HOSPITAL_BASED_OUTPATIENT_CLINIC_OR_DEPARTMENT_OTHER): Payer: Self-pay | Admitting: General Surgery

## 2011-05-25 ENCOUNTER — Encounter (INDEPENDENT_AMBULATORY_CARE_PROVIDER_SITE_OTHER): Payer: Self-pay | Admitting: General Surgery

## 2011-05-25 ENCOUNTER — Ambulatory Visit (INDEPENDENT_AMBULATORY_CARE_PROVIDER_SITE_OTHER): Payer: 59 | Admitting: General Surgery

## 2011-05-25 VITALS — BP 132/90 | HR 76 | Temp 96.4°F | Resp 12 | Ht 70.0 in | Wt 211.4 lb

## 2011-05-25 DIAGNOSIS — Z09 Encounter for follow-up examination after completed treatment for conditions other than malignant neoplasm: Secondary | ICD-10-CM

## 2011-05-25 NOTE — Progress Notes (Signed)
HPI Patient is status post excision of a right upper back lipoma.  PE The wound is healed very well with no evidence of infection . The dressing was removed. An incision was good.  Studiy review None  Assessment Status post excision of right upper back lipoma  Plan Return to see me on a p.r.n. basis the

## 2011-08-02 ENCOUNTER — Other Ambulatory Visit: Payer: Self-pay | Admitting: Family Medicine

## 2011-08-02 DIAGNOSIS — M79669 Pain in unspecified lower leg: Secondary | ICD-10-CM

## 2011-08-03 ENCOUNTER — Other Ambulatory Visit: Payer: 59

## 2012-04-22 ENCOUNTER — Other Ambulatory Visit: Payer: Self-pay

## 2012-04-22 ENCOUNTER — Emergency Department (HOSPITAL_COMMUNITY): Payer: 59

## 2012-04-22 ENCOUNTER — Encounter (HOSPITAL_COMMUNITY): Payer: Self-pay | Admitting: Emergency Medicine

## 2012-04-22 ENCOUNTER — Emergency Department (HOSPITAL_COMMUNITY)
Admission: EM | Admit: 2012-04-22 | Discharge: 2012-04-22 | Disposition: A | Discharge status: Home Health | Payer: 59 | Attending: Emergency Medicine | Admitting: Emergency Medicine

## 2012-04-22 DIAGNOSIS — Z7982 Long term (current) use of aspirin: Secondary | ICD-10-CM | POA: Insufficient documentation

## 2012-04-22 DIAGNOSIS — R002 Palpitations: Secondary | ICD-10-CM | POA: Insufficient documentation

## 2012-04-22 DIAGNOSIS — E785 Hyperlipidemia, unspecified: Secondary | ICD-10-CM | POA: Insufficient documentation

## 2012-04-22 DIAGNOSIS — G4730 Sleep apnea, unspecified: Secondary | ICD-10-CM | POA: Insufficient documentation

## 2012-04-22 DIAGNOSIS — Z888 Allergy status to other drugs, medicaments and biological substances status: Secondary | ICD-10-CM | POA: Insufficient documentation

## 2012-04-22 DIAGNOSIS — M479 Spondylosis, unspecified: Secondary | ICD-10-CM | POA: Insufficient documentation

## 2012-04-22 DIAGNOSIS — F17200 Nicotine dependence, unspecified, uncomplicated: Secondary | ICD-10-CM | POA: Insufficient documentation

## 2012-04-22 DIAGNOSIS — I2510 Atherosclerotic heart disease of native coronary artery without angina pectoris: Secondary | ICD-10-CM | POA: Insufficient documentation

## 2012-04-22 HISTORY — DX: Atherosclerotic heart disease of native coronary artery without angina pectoris: I25.10

## 2012-04-22 LAB — CBC WITH DIFFERENTIAL/PLATELET
Basophils Absolute: 0 10*3/uL (ref 0.0–0.1)
Basophils Relative: 1 % (ref 0–1)
Eosinophils Absolute: 0.2 10*3/uL (ref 0.0–0.7)
Eosinophils Relative: 3 % (ref 0–5)
HCT: 44.9 % (ref 39.0–52.0)
Hemoglobin: 15.5 g/dL (ref 13.0–17.0)
Lymphocytes Relative: 35 % (ref 12–46)
Lymphs Abs: 2 10*3/uL (ref 0.7–4.0)
MCH: 29.9 pg (ref 26.0–34.0)
MCHC: 34.5 g/dL (ref 30.0–36.0)
MCV: 86.5 fL (ref 78.0–100.0)
Monocytes Absolute: 0.4 10*3/uL (ref 0.1–1.0)
Monocytes Relative: 8 % (ref 3–12)
Neutro Abs: 3.2 10*3/uL (ref 1.7–7.7)
Neutrophils Relative %: 55 % (ref 43–77)
Platelets: 163 10*3/uL (ref 150–400)
RBC: 5.19 MIL/uL (ref 4.22–5.81)
RDW: 13.6 % (ref 11.5–15.5)
WBC: 5.8 10*3/uL (ref 4.0–10.5)

## 2012-04-22 LAB — URINALYSIS, ROUTINE W REFLEX MICROSCOPIC
Bilirubin Urine: NEGATIVE
Glucose, UA: NEGATIVE mg/dL
Hgb urine dipstick: NEGATIVE
Ketones, ur: NEGATIVE mg/dL
Leukocytes, UA: NEGATIVE
Nitrite: NEGATIVE
Protein, ur: NEGATIVE mg/dL
Specific Gravity, Urine: 1.007 (ref 1.005–1.030)
Urobilinogen, UA: 0.2 mg/dL (ref 0.0–1.0)
pH: 7 (ref 5.0–8.0)

## 2012-04-22 LAB — BASIC METABOLIC PANEL
BUN: 20 mg/dL (ref 6–23)
CO2: 27 mEq/L (ref 19–32)
Calcium: 9.6 mg/dL (ref 8.4–10.5)
Chloride: 104 mEq/L (ref 96–112)
Creatinine, Ser: 0.84 mg/dL (ref 0.50–1.35)
GFR calc Af Amer: >90 mL/min (ref >90)
GFR calc non Af Amer: >90 mL/min (ref >90)
Glucose, Bld: 100 mg/dL — ABNORMAL HIGH (ref 70–99)
Potassium: 4.1 mEq/L (ref 3.5–5.1)
Sodium: 140 mEq/L (ref 135–145)

## 2012-04-22 LAB — TROPONIN I
Troponin I: <0.3 ng/mL (ref <0.30)
Troponin I: <0.3 ng/mL (ref <0.30)

## 2012-04-22 NOTE — ED Notes (Signed)
Pt discharged to home with family. Patient voiced understanding of discharge instructions. NAD.

## 2012-04-22 NOTE — ED Provider Notes (Signed)
History     CSN: [id number]  Arrival date & time 04/22/12  8295   First MD Initiated Contact with Patient 04/22/12 [phone]      Chief Complaint  Patient presents with  . Palpitations    (Consider location/radiation/quality/duration/timing/severity/associated sxs/prior treatment) HPI Patient presents to the emergency department with palpitations of the last 3 days.  Patient, states, that he's had intermittent palpitations, with a strange sensation in the center of his chest.  Patient denies pain in his chest or shortness of breath.  Patient, states, that he had a cardiac catheterization over 10 years ago.  Patient denies nausea, vomiting, abdominal pain, back pain, headache, visual changes, fever, cough, or weakness.  Patient denies any exertional shortness of breath, or increased fatigue, with activity.  Patient, states, that the palpitations, last repairing amount of time from seconds to minutes. Past Medical History  Diagnosis Date  . Hyperlipidemia   . Sleep apnea     no CPAP use - denies apnea, waking up gasping/choking, denies daytime sleepiness  . Arthritis     lower back  . Kidney stones   . Lipoma     on back  . Coronary artery disease     Past Surgical History  Procedure Date  . Orif finger / thumb fracture 2002    right  . Fracture surgery 1974    right leg  . Lithotripsy   . Kidney stone surgery 2006  . Cystoscopy/retrograde/ureteroscopy/stone extraction with basket 1/02, 12/01  . Shoulder arthroscopy w/ subacromial decompression and distal clavicle excision 7/08    left  . Cardiac catheterization > 10 yrs.  . Lipoma excision 05/14/2011    Procedure: EXCISION LIPOMA;  Surgeon: Jetty Duhamel, MD;  Location: [facility];  Service: General;  Laterality: Right;  EXCISION RIGHT UPPER BACK LIPOMA    Family History  Problem Relation Age of Onset  . Anesthesia problems Mother     effects lasted for days  . Heart disease Mother   . Heart disease  Father   . Heart disease Sister   . Cancer Maternal Grandfather     lung  . Heart disease Paternal Grandmother   . Heart disease Paternal Grandfather     History  Substance Use Topics  . Smoking status: Current Some Day Smoker -- 38 years    Types: Cigars  . Smokeless tobacco: Never Used   Comment: 2 cigars/wk.  Marland Kitchen Alcohol Use: Yes     socially      Review of Systems All other systems negative except as documented in the HPI. All pertinent positives and negatives as reviewed in the HPI.  Allergies  Ciprofloxacin; Lactated ringers; and Tincture of benzoin  Home Medications   Current Outpatient Rx  Name Route Sig Dispense Refill  . ASPIRIN EC 81 MG PO TBEC Oral Take 81 mg by mouth daily.    . ATORVASTATIN CALCIUM 20 MG PO TABS Oral Take 20 mg by mouth at bedtime.     . OMEGA-3 FATTY ACIDS 1000 MG PO CAPS Oral Take 2,000 mg by mouth 2 (two) times daily.     . ADULT MULTIVITAMIN W/MINERALS CH Oral Take 1 tablet by mouth daily.    Marland Kitchen NAPROXEN 500 MG PO TABS Oral Take 500 mg by mouth 2 (two) times daily as needed. For pain      BP 136/94  Pulse 79  Temp 98.1 F (36.7 C) (Oral)  Resp 20  SpO2 97%  Physical Exam  Nursing note  and vitals reviewed. Constitutional: He is oriented to person, place, and time. He appears well-developed and well-nourished.  HENT:  Head: Normocephalic and atraumatic.  Mouth/Throat: Oropharynx is clear and moist.  Eyes: Pupils are equal, round, and reactive to light.  Neck: Normal range of motion. Neck supple. No thyromegaly present.  Cardiovascular: Normal rate, regular rhythm and normal heart sounds.  Exam reveals no gallop and no friction rub.   No murmur heard. Pulmonary/Chest: Effort normal and breath sounds normal. No respiratory distress.  Neurological: He is alert and oriented to person, place, and time.  Skin: Skin is warm and dry. No rash noted.  Psychiatric: He has a normal mood and affect. His behavior is normal. Judgment and thought  content normal.    ED Course  Procedures (including critical care time)  Labs Reviewed  BASIC METABOLIC PANEL - Abnormal; Notable for the following:    Glucose, Bld 100 (*)     All other components within normal limits  URINALYSIS, ROUTINE W REFLEX MICROSCOPIC - Abnormal; Notable for the following:    APPearance CLOUDY (*)     All other components within normal limits  CBC WITH DIFFERENTIAL  TROPONIN I  TROPONIN I   Dg Chest 2 View  04/22/2012  *RADIOLOGY REPORT*  Clinical Data: Chest pain, palpitations  CHEST - 2 VIEW  Comparison: 01/17/2007  Findings: Cardiomediastinal silhouette is stable.  No acute infiltrate or pleural effusion.  No pulmonary edema.  Bony thorax is stable.  IMPRESSION: No active disease.  No significant change.   Original Report Authenticated By: Natasha Mead, M.D.     The patient has been seen by the attending Physician.  Patient's been monitored without any abnormality seen on his tracings.  Patient is stable here in the emergency department without any signs of pain.  Patient has a second troponin is pending and if normal will refer the patient to cardiology for further evaluation and workup.  I was examining the patient and he stated he started having palpitations, and I observed the monitor did not have any changes.  Patient is advised return to the emergency department as needed for any worsening in his condition.   MDM  MDM Reviewed: vitals and nursing note Interpretation: labs, ECG and x-ray    Date: 04/22/2012  Rate: 86  Rhythm: normal sinus rhythm  QRS Axis: normal  Intervals: normal  ST/T Wave abnormalities: normal  Conduction Disutrbances:none  Narrative Interpretation:   Old EKG Reviewed: unchanged           Carlyle Dolly, PA-C 04/22/12 1301

## 2012-04-22 NOTE — ED Notes (Signed)
Pt c/o intermittent palpitations x 3 days worse last night and woke from sleep; pt denies SOB

## 2012-04-24 ENCOUNTER — Ambulatory Visit (INDEPENDENT_AMBULATORY_CARE_PROVIDER_SITE_OTHER): Payer: 59 | Admitting: Cardiology

## 2012-04-24 ENCOUNTER — Encounter: Payer: Self-pay | Admitting: *Deleted

## 2012-04-24 VITALS — BP 150/96 | HR 71 | Ht 70.0 in | Wt 198.8 lb

## 2012-04-24 DIAGNOSIS — I10 Essential (primary) hypertension: Secondary | ICD-10-CM | POA: Insufficient documentation

## 2012-04-24 DIAGNOSIS — R079 Chest pain, unspecified: Secondary | ICD-10-CM

## 2012-04-24 DIAGNOSIS — E782 Mixed hyperlipidemia: Secondary | ICD-10-CM | POA: Insufficient documentation

## 2012-04-24 DIAGNOSIS — E785 Hyperlipidemia, unspecified: Secondary | ICD-10-CM

## 2012-04-24 DIAGNOSIS — R030 Elevated blood-pressure reading, without diagnosis of hypertension: Secondary | ICD-10-CM

## 2012-04-24 DIAGNOSIS — R0789 Other chest pain: Secondary | ICD-10-CM | POA: Insufficient documentation

## 2012-04-24 DIAGNOSIS — R002 Palpitations: Secondary | ICD-10-CM

## 2012-04-24 NOTE — Progress Notes (Signed)
Patient ID: Steven Henson, male   DOB: 06-Sep-1960, 51 y.o.   MRN: [id number] 51 y.o with history CAD and hyperlipidemia presents for evaluation of chest discomfort.  He was seen by Dr. Clarene Duke until he retired and is seen by me for the first time today.  He had an episode of chest pain > 10 years ago and had a cath showing > 50% LAD stenosis.  This was managed medically. He says he had another cath the next year, showing no progression.  Since then, he has had Myoviews every other year.  He thinks that the last Myoview was in 2011 and showed no ischemia or infarction.    Patient reports that Thursday night while driving in his car, he developed a "strange sensation" in his chest that lasted about 10-15 minutes.  It was not exactly chest pain and not exactly palpitations, but his heart area felt "funny."  He woke up Friday morning with the same sensation and had it on and off all day long for 10-15 minutes at a time.  His wife took his pulse and says that it was irregular-feeling.  On Saturday morning (10/19), he went to the ER as he continued to have this "strange sensation" in his chest on and off.  Troponin was negative x 2.  No abnormalities were reported from telemetry monitoring.  He was sent home from the ER and told to followup with cardiology. Since Saturday, he has had only 1 further episode of the chest symptoms.    Patient denies any significant exertional symptoms.  The current episodes are not related to exertion.  He walks his dog and is active at work without exertional dyspnea or chest pain.  He still smokes about 2 cigars/week.  He used to be a more heavy smoker. BP today is elevated, but he checks frequently at home, and SBP tends to run in the 130s.   ECG: NSR, normal  Labs (10/13): Troponin negative x 2, K 4.1, creatinine 0.84  PMH: 1. Nephrolithiasis 2. H/o shoulder surgery 3. Hyperlipidemia 4. OSA: Not on CPAP 5. Low back pain 6. CAD: > 10 years ago, patient had a cath after a  chest pain episode.  He was found to have > 50% stenosis in the LAD.  This was managed medically.  He had a repeat cath a year later to look for progression and per his report, this looked better.  Do not have these records in system.  He was followed by Dr. Clarene Duke and had myoviews every other year since then.  Last myoview was in 2011 and per his report was normal.    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  He is married.  Smokes 2 cigars/week.    FH: Father with CAD, MI in his 8s.   ROS: All systems reviewed and negative except as per HPI.   Current Outpatient Prescriptions  Medication Sig Dispense Refill  . aspirin EC 81 MG tablet Take 81 mg by mouth daily.      Marland Kitchen atorvastatin (LIPITOR) 20 MG tablet Take 20 mg by mouth at bedtime.       . fish oil-omega-3 fatty acids 1000 MG capsule Take 2,000 mg by mouth 2 (two) times daily.       . Multiple Vitamin (MULTIVITAMIN WITH MINERALS) TABS Take 1 tablet by mouth daily.      . naproxen (NAPROSYN) 500 MG tablet Take 500 mg by mouth 2 (two) times daily as needed. For pain  BP 150/96  Pulse 71  Ht 5\' 10"  (1.778 m)  Wt 198 lb 12.8 oz (90.175 kg)  BMI 28.52 kg/m2 General: NAD Neck: No JVD, no thyromegaly or thyroid nodule.  Lungs: Clear to auscultation bilaterally with normal respiratory effort. CV: Nondisplaced PMI.  Heart regular S1/S2, no S3/S4, no murmur.  No peripheral edema.  No carotid bruit.  Normal pedal pulses.  Abdomen: Soft, nontender, no hepatosplenomegaly, no distention.  Skin: Intact without lesions or rashes.  Neurologic: Alert and oriented x 3.  Psych: Normal affect. Extremities: No clubbing or cyanosis.  HEENT: Normal.   Assessment/Plan: 1. Chest pain/"strange sensation": Atypical chest symptoms in a patient with known CAD.  Episodes are worrisome to him and has been occurring on and off for several days now.  Not clear if this really represents a chest pain sensation or palpitations.  His wife does report that his pulse felt  irregular when she checked it.  ECG is normal today and no arrhythmias were reported from the ER.  - 24 hour holter monitor to look for arrhythmias.  - ETT-myoview to assess for ischemia given known CAD.  - Continue ASA 81 and statin.  - Will try to get full records of patient's cardiac disease history from [facility] cardiology.  2. Hyperlipidemia: Check lipids, goal LDL< 70 given known CAD.  3. Elevated blood pressure: BP high today but tends to run systolic 130s at home.  I will have him check his BP daily and record the numbers.  He will show me his recordings when he returns here in 2 wks after stress test.   Marca Ancona 04/24/2012 4:24 PM

## 2012-04-24 NOTE — Patient Instructions (Addendum)
Your physician has recommended that you wear a holter monitor. Holter monitors are medical devices that record the heart's electrical activity. Doctors most often use these monitors to diagnose arrhythmias. Arrhythmias are problems with the speed or rhythm of the heartbeat. The monitor is a small, portable device. You can wear one while you do your normal daily activities. This is usually used to diagnose what is causing palpitations/syncope (passing out). 24 hour  Your physician has requested that you have en exercise stress myoview. For further information please visit [email or web address] Please follow instruction sheet, as given.  Your physician recommends that you return for a FASTING lipid profile.   Your physician recommends that you schedule a follow-up appointment in: about 2 weeks with Dr Shirlee Latch.  Take and record your blood pressure daily. Bring a record of these readings to your appointment with Dr Shirlee Latch in 2 weeks.

## 2012-04-24 NOTE — ED Provider Notes (Signed)
Medical screening examination/treatment/procedure(s) were conducted as a shared visit with non-physician practitioner(s) and myself.  I personally evaluated the patient during the encounter Pt c/o palpitations. Chest cta. Rrr.   Suzi Roots, MD 04/24/12 [phone]

## 2012-05-02 ENCOUNTER — Ambulatory Visit (HOSPITAL_COMMUNITY): Payer: 59 | Attending: Cardiology | Admitting: Radiology

## 2012-05-02 ENCOUNTER — Other Ambulatory Visit (INDEPENDENT_AMBULATORY_CARE_PROVIDER_SITE_OTHER): Payer: 59

## 2012-05-02 ENCOUNTER — Encounter (INDEPENDENT_AMBULATORY_CARE_PROVIDER_SITE_OTHER): Payer: 59

## 2012-05-02 VITALS — BP 122/81 | HR 69 | Ht 70.0 in | Wt 191.0 lb

## 2012-05-02 DIAGNOSIS — R079 Chest pain, unspecified: Secondary | ICD-10-CM

## 2012-05-02 DIAGNOSIS — F17200 Nicotine dependence, unspecified, uncomplicated: Secondary | ICD-10-CM | POA: Insufficient documentation

## 2012-05-02 DIAGNOSIS — I2510 Atherosclerotic heart disease of native coronary artery without angina pectoris: Secondary | ICD-10-CM | POA: Insufficient documentation

## 2012-05-02 DIAGNOSIS — R002 Palpitations: Secondary | ICD-10-CM

## 2012-05-02 LAB — LIPID PANEL: Total CHOL/HDL Ratio: 2

## 2012-05-02 MED ORDER — TECHNETIUM TC 99M SESTAMIBI GENERIC - CARDIOLITE
10.0000 | Freq: Once | INTRAVENOUS | Status: AC | PRN
  Administered 2012-05-02: 10 via INTRAVENOUS

## 2012-05-02 MED ORDER — TECHNETIUM TC 99M SESTAMIBI GENERIC - CARDIOLITE
30.0000 | Freq: Once | INTRAVENOUS | Status: AC | PRN
  Administered 2012-05-02: 30 via INTRAVENOUS

## 2012-05-02 NOTE — Progress Notes (Addendum)
[facility] SITE 3 NUCLEAR MED [address] [id number] [place] [address] [phone]  Cardiology Nuclear Med Study  Steven Henson is a 51 y.o. male     MRN : [id number]     DOB: 01/09/1961  Procedure Date: 05/02/2012  Nuclear Med Background Indication for Stress Test:  Evaluation for Ischemia History:  >10 yrs. ago Cath:n/o CAD, medical mgmt.; '11 [id number]; 04/22/12 [facility] with "Strange feeling in chest", (-) enzymes. Cardiac Risk Factors: Family History - CAD, Lipids and Smoker (Cigars)  Symptoms:  Palpitations and "Funny Feeling in Chest"   Nuclear Pre-Procedure Caffeine/Decaff Intake:  None > 12 hrs NPO After: 10:00pm   Lungs:  Clear. O2 Sat: 99% on room air. IV 0.9% NS with Angio Cath:  20g  IV Site: R Hand x 1, tolerated well IV Started by:  Irean Hong, RN  Chest Size (in):  42 Cup Size: n/a  Height: 5\' 10"  (1.778 m)  Weight:  191 lb (86.637 kg)  BMI:  Body mass index is 27.41 kg/(m^2). Tech Comments:  n/a    Nuclear Med Study 1 or 2 day study: 1 day  Stress Test Type:  Stress  Reading MD: Olga Millers, MD  Order Authorizing Provider:  Marca Ancona, MD  Resting Radionuclide: Technetium 39m Sestamibi  Resting Radionuclide Dose: 11.0 mCi   Stress Radionuclide:  Technetium 46m Sestamibi  Stress Radionuclide Dose: 33.0 mCi           Stress Protocol Rest HR: 69 Stress HR: 164  Rest BP: 122/81 Stress BP: 172/85  Exercise Time (min): 5:45 METS: 7.0   Predicted Max HR: 167 bpm % Max HR: 98.2 bpm Rate Pressure Product: 03474   Dose of Adenosine (mg):  n/a Dose of Lexiscan: n/a mg  Dose of Atropine (mg): n/a Dose of Dobutamine: n/a mcg/kg/min (at max HR)  Stress Test Technologist: Smiley Houseman, CMA-N  Nuclear Technologist:  Domenic Polite, CNMT     Rest Procedure:  Myocardial perfusion imaging was performed at rest 45 minutes following the intravenous administration of Technetium 23m Sestamibi.  Rest ECG: No acute  changes.  Stress Procedure:  The patient exercised on the treadmill utilizing the Bruce protocol for 5:45 minutes. He then stopped due to moderate dyspnea and dizziness.  He denied any chest pain.  There were nonspecific ST-T wave changes with exercise and T-wave inversion in recovery.  Technetium 82m Sestamibi was injected at peak exercise and myocardial perfusion imaging was performed after a brief delay.  Stress ECG: Significant ST abnormalities consistent with ischemia.  QPS Raw Data Images:  Acquisition technically good; normal left ventricular size. Stress Images:  Normal homogeneous uptake in all areas of the myocardium. Rest Images:  Normal homogeneous uptake in all areas of the myocardium. Subtraction (SDS):  No evidence of ischemia. Transient Ischemic Dilatation (Normal <1.22):  1.02 Lung/Heart Ratio (Normal <0.45):  0.09  Quantitative Gated Spect Images QGS EDV:  101 ml QGS ESV:  48 ml  Impression Exercise Capacity:  Fair exercise capacity. BP Response:  Normal blood pressure response. Clinical Symptoms:  There is dyspnea. ECG Impression:  Significant ST abnormalities consistent with ischemia. Comparison with Prior Nuclear Study: No images to compare  Overall Impression:  Normal stress nuclear study.  LV Ejection Fraction: 53%.  LV Wall Motion:  NL LV Function; NL Wall Motion  Olga Millers  There were worrisome ST changes on ECG but perfusion images were normal and EF was near-normal. Possibly false + ECG.  I will see  him in followup as scheduled.  If no further symptoms, probably no cath.  However, if he has had more symptoms, would consider cath.    Marca Ancona 05/03/2012 9:59 AM

## 2012-05-03 ENCOUNTER — Telehealth: Payer: Self-pay | Admitting: *Deleted

## 2012-05-03 NOTE — Telephone Encounter (Signed)
Advised patient of lab results  

## 2012-05-03 NOTE — Telephone Encounter (Signed)
Message copied by Burnell Blanks on Wed May 03, 2012 10:39 AM ------      Message from: Laurey Morale      Created: Wed May 03, 2012  9:59 AM       Excellent lipids

## 2012-05-03 NOTE — Progress Notes (Signed)
Pt called with Dr Alford Highland review and recommendations, pt has f/u app set and will keep app.

## 2012-05-09 ENCOUNTER — Ambulatory Visit (INDEPENDENT_AMBULATORY_CARE_PROVIDER_SITE_OTHER): Payer: 59 | Admitting: Cardiology

## 2012-05-09 ENCOUNTER — Encounter: Payer: Self-pay | Admitting: Cardiology

## 2012-05-09 VITALS — BP 116/78 | HR 80 | Ht 70.0 in | Wt 194.0 lb

## 2012-05-09 DIAGNOSIS — R002 Palpitations: Secondary | ICD-10-CM

## 2012-05-09 DIAGNOSIS — E785 Hyperlipidemia, unspecified: Secondary | ICD-10-CM

## 2012-05-09 DIAGNOSIS — R079 Chest pain, unspecified: Secondary | ICD-10-CM

## 2012-05-09 NOTE — Patient Instructions (Signed)
Your physician wants you to follow-up in: 1 year with Dr McLean. (November 2014).  You will receive a reminder letter in the mail two months in advance. If you don't receive a letter, please call our office to schedule the follow-up appointment.  

## 2012-05-09 NOTE — Progress Notes (Signed)
Patient ID: Steven Henson, male   DOB: 1961/02/07, 51 y.o.   MRN: [id number] 51 yo with history CAD and hyperlipidemia presents for cardiology followup.  He had an episode of chest pain in 1999 and had a cath showing 50% ostial LAD stenosis.  This was managed medically. He had another cath the next year showing 30% ostial LAD stenosis.    Prior to last appointment, he developed a "strange sensation" in his chest that lasted about 10-15 minutes.  It was not exactly chest pain and not exactly palpitations, but his heart area felt "funny."  He had the same sensation the next day on and off all day long for 10-15 minutes at a time.  His wife took his pulse and says that it was irregular-feeling.  The next morning (10/19), he went to the ER as he continued to have this "strange sensation" in his chest on and off.  Troponin was negative x 2.  No abnormalities were reported from telemetry monitoring.  He was sent home from the ER and told to followup with cardiology.   I had him do an ETT-myoview.  This showed below average exercise tolerance (limited by dyspnea) and significant ECG changes, but perfusion images were normal.  Holter monitor showed only rare PACs and PVCs.    Since last appointment, he has had no further episodes of the "strange sensation" in his chest.  He denies any significant exertional symptoms.  He walks his dog, does yardwork, and is active at work without exertional dyspnea or chest pain.  He still smokes about 2 cigars/week.  He used to be a more heavy smoker.  He brings in BP numbers for the last couple of weeks.  For the most part, BP is well-controlled (SBP averages about 130).   Labs (10/13): Troponin negative x 2, K 4.1, creatinine 0.84  PMH: 1. Nephrolithiasis 2. H/o shoulder surgery 3. Hyperlipidemia 4. OSA: Not on CPAP 5. Low back pain 6. CAD: LHC (1999) with 50% ostial LAD stenosis.  LHC (2000) with 30% ostial LAD stenosis.  Myoview (11/10): Low risk.  ETT-myoview (10/13)  with 5'45" exercise, stopped due to dypsnea, no ischemia or infarction on perfusion images but there were ECG changes.  EF 53%.   7. Palpitations: Holter (10/13) with rare PACs and PVCs.  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  Smokes 2 cigars/week.    FH: Father with CAD, MI in his 90s.    Current Outpatient Prescriptions  Medication Sig Dispense Refill  . aspirin EC 81 MG tablet Take 81 mg by mouth daily.      Marland Kitchen atorvastatin (LIPITOR) 20 MG tablet Take 20 mg by mouth at bedtime.       . fish oil-omega-3 fatty acids 1000 MG capsule Take 2,000 mg by mouth 2 (two) times daily.       . Multiple Vitamin (MULTIVITAMIN WITH MINERALS) TABS Take 1 tablet by mouth daily.      . naproxen (NAPROSYN) 500 MG tablet Take 500 mg by mouth 2 (two) times daily as needed. For pain        BP 116/78  Pulse 80  Ht 5\' 10"  (1.778 m)  Wt 194 lb (87.998 kg)  BMI 27.84 kg/m2 General: NAD Neck: No JVD, no thyromegaly or thyroid nodule.  Lungs: Clear to auscultation bilaterally with normal respiratory effort. CV: Nondisplaced PMI.  Heart regular S1/S2, no S3/S4, no murmur.  No peripheral edema.  No carotid bruit.  Normal pedal pulses.  Abdomen: Soft, nontender, no hepatosplenomegaly, no distention.  Neurologic: Alert and oriented x 3.  Psych: Normal affect. Extremities: No clubbing or cyanosis.   Assessment/Plan: 1. Chest pain/"strange sensation": Atypical chest symptoms in a patient with known CAD.  Symptoms have resolved and he has been doing fine since last appointment.  Myoview was low risk with no ischemia or infarction on perfusion images.  There were significant ECG changes but given normal perfusion images, most likely this was a false positive.  Exercise tolerance was below average and limited by dyspnea.  Given no further symptoms and low risk myoview, will continue current management with ASA 81 and statin.  I encouraged more regular exercise.   2. Hyperlipidemia: LDL at goal (< 70).  3. Blood  pressure: BP seems under reasonable control at home.  4. Palpitations: Holter with rare PVCs and PACs.    Followup in 1 year if no further symptoms.   Marca Ancona 05/09/2012 9:29 AM

## 2012-05-18 ENCOUNTER — Encounter: Payer: Self-pay | Admitting: Cardiology

## 2012-06-07 ENCOUNTER — Other Ambulatory Visit: Payer: Self-pay | Admitting: *Deleted

## 2012-06-07 MED ORDER — ATORVASTATIN CALCIUM 20 MG PO TABS: 20.0000 mg | ORAL_TABLET | Freq: Every day | ORAL | Status: DC

## 2013-02-26 ENCOUNTER — Ambulatory Visit: Payer: 59 | Attending: Orthopedic Surgery | Admitting: Physical Therapy

## 2013-02-26 DIAGNOSIS — M545 Low back pain, unspecified: Secondary | ICD-10-CM | POA: Insufficient documentation

## 2013-02-26 DIAGNOSIS — M25569 Pain in unspecified knee: Secondary | ICD-10-CM | POA: Insufficient documentation

## 2013-02-26 DIAGNOSIS — IMO0001 Reserved for inherently not codable concepts without codable children: Secondary | ICD-10-CM | POA: Insufficient documentation

## 2013-03-07 ENCOUNTER — Ambulatory Visit: Payer: 59 | Attending: Orthopedic Surgery | Admitting: Physical Therapy

## 2013-03-07 DIAGNOSIS — IMO0001 Reserved for inherently not codable concepts without codable children: Secondary | ICD-10-CM | POA: Insufficient documentation

## 2013-03-07 DIAGNOSIS — M545 Low back pain, unspecified: Secondary | ICD-10-CM | POA: Insufficient documentation

## 2013-03-07 DIAGNOSIS — M25569 Pain in unspecified knee: Secondary | ICD-10-CM | POA: Insufficient documentation

## 2013-03-13 ENCOUNTER — Ambulatory Visit: Payer: 59 | Admitting: Physical Therapy

## 2013-03-14 ENCOUNTER — Ambulatory Visit: Payer: 59 | Admitting: Physical Therapy

## 2013-03-21 ENCOUNTER — Ambulatory Visit: Payer: 59 | Admitting: Physical Therapy

## 2013-06-06 ENCOUNTER — Encounter: Payer: Self-pay | Admitting: Cardiovascular Disease

## 2013-06-06 ENCOUNTER — Ambulatory Visit (INDEPENDENT_AMBULATORY_CARE_PROVIDER_SITE_OTHER): Payer: 59 | Admitting: Cardiovascular Disease

## 2013-06-06 VITALS — BP 146/88 | HR 110 | Resp 12 | Ht 70.0 in | Wt 203.0 lb

## 2013-06-06 DIAGNOSIS — R002 Palpitations: Secondary | ICD-10-CM

## 2013-06-06 MED ORDER — METOPROLOL TARTRATE 25 MG PO TABS: 25.0000 mg | ORAL_TABLET | Freq: Two times a day (BID) | ORAL | Status: DC

## 2013-06-06 NOTE — Progress Notes (Signed)
    HPI:  52 year old gentleman presenting as a walk-in patient today. He has been followed by Dr. Sherlie Ban for coronary artery disease and hyperlipidemia. The patient has nonobstructive CAD dating back to 1999 when he underwent heart catheterization. His last stress test was one year ago and it demonstrated normal perfusion. His left ventricular function has been normal (LVEF 53% by Myoview).  The patient works as a Radiation protection practitioner. He has had palpitations on and off for 2 weeks. His palpitations have been worse over the last 24 hours and this prompted him to walk into the office today. He has not had associated chest tightness or shortness of breath. He drinks 2 cups of coffee every day and reports no recent changes in habits. He uses no over-the-counter or herbal medications. He's had no fevers, chills, or recent illness. The patient has a history of heart palpitations and has undergone outpatient monitoring in the past without significant arrhythmia identified.  Outpatient Encounter Prescriptions as of 06/06/2013  Medication Sig  . aspirin EC 81 MG tablet Take 81 mg by mouth daily.  Marland Kitchen atorvastatin (LIPITOR) 20 MG tablet Take 1 tablet (20 mg total) by mouth at bedtime.  . Celecoxib (CELEBREX PO) Take by mouth.  . fish oil-omega-3 fatty acids 1000 MG capsule Take 2,000 mg by mouth 2 (two) times daily.   . Multiple Vitamin (MULTIVITAMIN WITH MINERALS) TABS Take 1 tablet by mouth daily.  . metoprolol tartrate (LOPRESSOR) 25 MG tablet Take 1 tablet (25 mg total) by mouth 2 (two) times daily.  . [DISCONTINUED] metoprolol tartrate (LOPRESSOR) 25 MG tablet Take 1 tablet (25 mg total) by mouth 2 (two) times daily.  . [DISCONTINUED] naproxen (NAPROSYN) 500 MG tablet Take 500 mg by mouth 2 (two) times daily as needed. For pain    Allergies  Allergen Reactions  . Ciprofloxacin Diarrhea and Nausea And Vomiting    Hypotension  . Lactated Ringers Other (See Comments)    Really bad muscle spasms  . Tincture Of  Benzoin [Benzoin Compound]     Past Medical History  Diagnosis Date  . Hyperlipidemia   . Sleep apnea     no CPAP use - denies apnea, waking up gasping/choking, denies daytime sleepiness  . Arthritis     lower back  . Kidney stones   . Lipoma     on back  . Coronary artery disease     ROS: Negative except as per HPI  BP 146/88  Pulse 110  Resp 12  Ht 5\' 10"  (1.778 m)  Wt 203 lb (92.08 kg)  BMI 29.13 kg/m2  PHYSICAL EXAM: Pt is alert and oriented, anxious-appearing male in NAD HEENT: normal Neck: JVP - normal, carotids 2+= without bruits Lungs: CTA bilaterally CV: RRR without murmur or gallop Abd: soft, NT, Positive BS, no hepatomegaly Ext: no C/C/E, distal pulses intact and equal Skin: warm/dry no rash  EKG:  Sinus tachycardia with PACs  ASSESSMENT AND PLAN: 1. Heart palpitations. Suspect benign palpitations related to PACs. Discussed caffeine reduction. Will start metoprolol 25 mg twice daily. He will keep his regularly scheduled followup with Dr. Jearld Pies next month and will contact us sooner if symptoms worsen.  2. Coronary artery disease, native vessel. Continue current medical management with aspirin and a statin drug.  Tonny Bollman 06/06/2013 5:12 PM

## 2013-06-06 NOTE — Patient Instructions (Addendum)
Your physician has recommended you make the following change in your medication:  Start metoprolol 25 mg twice daily 12 hours apart.  Your physician recommends that you schedule a follow-up appointment in: keep appointment already scheduled with Dr Shirlee Latch  Please monitor bp/p regularly, decrease caffeine to 1 cup daily, increase water intake and call with questions or concerns.

## 2013-06-06 NOTE — Progress Notes (Signed)
Pt walked into the office today complaining of an irregular heart rhythm and requesting to be seen now.  Triage was called to assess. Pt ambulated without assist, does not appear to be in distress but seems anxious. Pt states he has an appointment in the near future with Dr Shirlee Latch but is feeling worse today. Pt is a paramedic. States he has had feelings of a fast heart rate off and on x 2 weeks. States he feels a tightness at top of sternum near throat, denies cp/nausea/diaphoresis.  EKG completed and it was reviewed and then seen by DOD Dr Excell Seltzer.

## 2013-06-26 ENCOUNTER — Other Ambulatory Visit: Payer: Self-pay | Admitting: Cardiology

## 2013-07-17 ENCOUNTER — Encounter: Payer: Self-pay | Admitting: Cardiology

## 2013-07-17 ENCOUNTER — Ambulatory Visit (INDEPENDENT_AMBULATORY_CARE_PROVIDER_SITE_OTHER): Payer: 59 | Admitting: Cardiology

## 2013-07-17 VITALS — BP 120/78 | HR 66 | Ht 71.0 in | Wt 211.0 lb

## 2013-07-17 DIAGNOSIS — R002 Palpitations: Secondary | ICD-10-CM

## 2013-07-17 DIAGNOSIS — I2510 Atherosclerotic heart disease of native coronary artery without angina pectoris: Secondary | ICD-10-CM

## 2013-07-17 DIAGNOSIS — E785 Hyperlipidemia, unspecified: Secondary | ICD-10-CM

## 2013-07-17 LAB — LIPID PANEL
CHOL/HDL RATIO: 3
Cholesterol: 188 mg/dL (ref 0–200)
HDL: 61 mg/dL (ref >39.00)
LDL Cholesterol: 97 mg/dL (ref 0–99)
Triglycerides: 151 mg/dL — ABNORMAL HIGH (ref 0.0–149.0)
VLDL: 30.2 mg/dL (ref 0.0–40.0)

## 2013-07-17 LAB — TSH: TSH: 1.03 u[IU]/mL (ref 0.35–5.50)

## 2013-07-17 LAB — BASIC METABOLIC PANEL
BUN: 19 mg/dL (ref 6–23)
CALCIUM: 9 mg/dL (ref 8.4–10.5)
CO2: 26 meq/L (ref 19–32)
Chloride: 105 mEq/L (ref 96–112)
Creatinine, Ser: 0.9 mg/dL (ref 0.4–1.5)
GFR: 91.94 mL/min (ref >60.00)
Glucose, Bld: 95 mg/dL (ref 70–99)
POTASSIUM: 4.3 meq/L (ref 3.5–5.1)
SODIUM: 138 meq/L (ref 135–145)

## 2013-07-17 LAB — MAGNESIUM: Magnesium: 2 mg/dL (ref 1.5–2.5)

## 2013-07-17 MED ORDER — METOPROLOL TARTRATE 25 MG PO TABS: 25.0000 mg | ORAL_TABLET | Freq: Two times a day (BID) | ORAL | Status: DC

## 2013-07-17 NOTE — Patient Instructions (Signed)
Your physician recommends that you return for lab work today--BMET/Magnesium/TSH/Lipid profile.  Your physician wants you to follow-up in: 1 year with Dr Aundra Dubin. (January 2016). You will receive a reminder letter in the mail two months in advance. If you don't receive a letter, please call our office to schedule the follow-up appointment.

## 2013-07-18 DIAGNOSIS — I2510 Atherosclerotic heart disease of native coronary artery without angina pectoris: Secondary | ICD-10-CM | POA: Insufficient documentation

## 2013-07-18 NOTE — Progress Notes (Signed)
Patient ID: Steven Henson, male   DOB: [birth year], 53 y.o.   MRN: [id number] 53 yo with history CAD and hyperlipidemia presents for cardiology followup.  He had an episode of chest pain in 1999 and had a cath showing 50% ostial LAD stenosis.  This was managed medically. He had another cath the next year showing 30% ostial LAD stenosis.  No ischemia on ETT-Myoview in 10/13.  Patient developed an increase in palpitations in 12/14.  He came by the office and was noted to have PACs.  The skipping of his heart would "take away" his breath.  No new stressors.  He was started on metoprolol 25 mg bid.  This seemed to resolve his palpitations.  Patient does continue to drink 2-3 cups coffee each morning. No chest pain, no exertional dyspnea.  He continues to smoke a couple of cigars/week.   Labs (10/13): Troponin negative x 2, K 4.1, creatinine 0.84  PMH: 1. Nephrolithiasis 2. H/o shoulder surgery 3. Hyperlipidemia 4. OSA: Not on CPAP 5. Low back pain 6. CAD: LHC (1999) with 50% ostial LAD stenosis.  LHC (2000) with 30% ostial LAD stenosis.  Myoview (11/10): Low risk.  ETT-myoview (10/13) with 5'45" exercise, stopped due to dypsnea, no ischemia or infarction on perfusion images but there were ECG changes.  EF 53%.   7. Palpitations: Holter (10/13) with rare PACs and PVCs.  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  He is married.  Smokes 2 cigars/week.    FH: Father with CAD, MI in his 18s.    Current Outpatient Prescriptions  Medication Sig Dispense Refill  . aspirin EC 81 MG tablet Take 81 mg by mouth daily.      Marland Kitchen atorvastatin (LIPITOR) 20 MG tablet Take 1 tablet by mouth at  bedtime  90 tablet  0  . Celecoxib (CELEBREX PO) Take by mouth.      . fish oil-omega-3 fatty acids 1000 MG capsule Take 2,000 mg by mouth 2 (two) times daily.       . metoprolol tartrate (LOPRESSOR) 25 MG tablet Take 1 tablet (25 mg total) by mouth 2 (two) times daily.  180 tablet  3  . predniSONE (DELTASONE) 20 MG tablet as needed.         No current facility-administered medications for this visit.    BP 120/78  Pulse 66  Ht 5\' 11"  (1.803 m)  Wt 95.709 kg (211 lb)  BMI 29.44 kg/m2 General: NAD Neck: No JVD, no thyromegaly or thyroid nodule.  Lungs: Clear to auscultation bilaterally with normal respiratory effort. CV: Nondisplaced PMI.  Heart regular S1/S2, no S3/S4, no murmur.  No peripheral edema.  No carotid bruit.  Normal pedal pulses.  Abdomen: Soft, nontender, no hepatosplenomegaly, no distention.  Neurologic: Alert and oriented x 3.  Psych: Normal affect. Extremities: No clubbing or cyanosis.   Assessment/Plan: 1. CAD: Nonobstructive on prior caths. No further chest pain. Continue ASA 81 and statin.  2. Hyperlipidemia: Check lipids today.   3. Blood pressure: BP seems under reasonable control.  4. Palpitations: PACs/PVCs, symptomatic at times.  Symptoms resolved with metoprolol.  Continue metoprolol.  Cut back on caffeine.  Will get BMET and magnesium level, will get TSH.  5. Smoking: I asked him to cut out cigars all together.    Followup in 1 year if no further symptoms.   Loralie Champagne 07/18/2013 1:22 PM

## 2013-07-23 ENCOUNTER — Other Ambulatory Visit: Payer: Self-pay | Admitting: *Deleted

## 2013-07-23 DIAGNOSIS — I2510 Atherosclerotic heart disease of native coronary artery without angina pectoris: Secondary | ICD-10-CM

## 2013-07-23 DIAGNOSIS — E785 Hyperlipidemia, unspecified: Secondary | ICD-10-CM

## 2013-07-23 MED ORDER — ATORVASTATIN CALCIUM 40 MG PO TABS: 40.0000 mg | ORAL_TABLET | Freq: Every day | ORAL | Status: DC

## 2013-09-05 ENCOUNTER — Encounter (HOSPITAL_COMMUNITY): Payer: Self-pay | Admitting: Emergency Medicine

## 2013-09-05 ENCOUNTER — Emergency Department (HOSPITAL_COMMUNITY): Payer: 59

## 2013-09-05 ENCOUNTER — Emergency Department (HOSPITAL_COMMUNITY)
Admission: EM | Admit: 2013-09-05 | Discharge: 2013-09-05 | Disposition: A | Discharge status: Home / Self Care | Payer: 59 | Attending: Emergency Medicine | Admitting: Emergency Medicine

## 2013-09-05 DIAGNOSIS — Z79899 Other long term (current) drug therapy: Secondary | ICD-10-CM | POA: Insufficient documentation

## 2013-09-05 DIAGNOSIS — Z87442 Personal history of urinary calculi: Secondary | ICD-10-CM | POA: Insufficient documentation

## 2013-09-05 DIAGNOSIS — F17200 Nicotine dependence, unspecified, uncomplicated: Secondary | ICD-10-CM | POA: Insufficient documentation

## 2013-09-05 DIAGNOSIS — E785 Hyperlipidemia, unspecified: Secondary | ICD-10-CM | POA: Insufficient documentation

## 2013-09-05 DIAGNOSIS — R42 Dizziness and giddiness: Secondary | ICD-10-CM

## 2013-09-05 DIAGNOSIS — R55 Syncope and collapse: Secondary | ICD-10-CM

## 2013-09-05 DIAGNOSIS — Z9889 Other specified postprocedural states: Secondary | ICD-10-CM | POA: Insufficient documentation

## 2013-09-05 DIAGNOSIS — Z872 Personal history of diseases of the skin and subcutaneous tissue: Secondary | ICD-10-CM | POA: Insufficient documentation

## 2013-09-05 DIAGNOSIS — M129 Arthropathy, unspecified: Secondary | ICD-10-CM | POA: Insufficient documentation

## 2013-09-05 DIAGNOSIS — Z7982 Long term (current) use of aspirin: Secondary | ICD-10-CM | POA: Insufficient documentation

## 2013-09-05 DIAGNOSIS — I2510 Atherosclerotic heart disease of native coronary artery without angina pectoris: Secondary | ICD-10-CM | POA: Insufficient documentation

## 2013-09-05 DIAGNOSIS — Z791 Long term (current) use of non-steroidal anti-inflammatories (NSAID): Secondary | ICD-10-CM | POA: Insufficient documentation

## 2013-09-05 HISTORY — DX: Essential (primary) hypertension: I10

## 2013-09-05 LAB — URINALYSIS, ROUTINE W REFLEX MICROSCOPIC
Bilirubin Urine: NEGATIVE
GLUCOSE, UA: NEGATIVE mg/dL
HGB URINE DIPSTICK: NEGATIVE
KETONES UR: NEGATIVE mg/dL
Leukocytes, UA: NEGATIVE
Nitrite: NEGATIVE
PH: 7 (ref 5.0–8.0)
Protein, ur: NEGATIVE mg/dL
Specific Gravity, Urine: 1.009 (ref 1.005–1.030)
Urobilinogen, UA: 0.2 mg/dL (ref 0.0–1.0)

## 2013-09-05 LAB — COMPREHENSIVE METABOLIC PANEL
ALBUMIN: 3.9 g/dL (ref 3.5–5.2)
ALT: 26 U/L (ref 0–53)
AST: 19 U/L (ref 0–37)
Alkaline Phosphatase: 85 U/L (ref 39–117)
BILIRUBIN TOTAL: 0.6 mg/dL (ref 0.3–1.2)
BUN: 23 mg/dL (ref 6–23)
CO2: 24 mEq/L (ref 19–32)
CREATININE: 0.82 mg/dL (ref 0.50–1.35)
Calcium: 9.3 mg/dL (ref 8.4–10.5)
Chloride: 104 mEq/L (ref 96–112)
GFR calc Af Amer: >90 mL/min (ref >90)
GFR calc non Af Amer: >90 mL/min (ref >90)
Glucose, Bld: 94 mg/dL (ref 70–99)
Potassium: 4.4 mEq/L (ref 3.7–5.3)
Sodium: 141 mEq/L (ref 137–147)
Total Protein: 6.9 g/dL (ref 6.0–8.3)

## 2013-09-05 LAB — CBC WITH DIFFERENTIAL/PLATELET
BASOS ABS: 0 10*3/uL (ref 0.0–0.1)
BASOS PCT: 1 % (ref 0–1)
Eosinophils Absolute: 0.2 10*3/uL (ref 0.0–0.7)
Eosinophils Relative: 3 % (ref 0–5)
HEMATOCRIT: 45.2 % (ref 39.0–52.0)
Hemoglobin: 15.3 g/dL (ref 13.0–17.0)
Lymphocytes Relative: 34 % (ref 12–46)
Lymphs Abs: 2.3 10*3/uL (ref 0.7–4.0)
MCH: 29.7 pg (ref 26.0–34.0)
MCHC: 33.8 g/dL (ref 30.0–36.0)
MCV: 87.8 fL (ref 78.0–100.0)
MONO ABS: 0.7 10*3/uL (ref 0.1–1.0)
Monocytes Relative: 10 % (ref 3–12)
NEUTROS ABS: 3.5 10*3/uL (ref 1.7–7.7)
Neutrophils Relative %: 52 % (ref 43–77)
PLATELETS: 171 10*3/uL (ref 150–400)
RBC: 5.15 MIL/uL (ref 4.22–5.81)
RDW: 13.4 % (ref 11.5–15.5)
WBC: 6.7 10*3/uL (ref 4.0–10.5)

## 2013-09-05 LAB — TROPONIN I: Troponin I: <0.3 ng/mL (ref <0.30)

## 2013-09-05 MED ORDER — ONDANSETRON HCL 4 MG/2ML IJ SOLN: 4.0000 mg | Freq: Once | INTRAMUSCULAR | Status: DC

## 2013-09-05 NOTE — Discharge Instructions (Signed)
Rest, eat and drink normally today. Dr Stanford Breed, a partner of Dr Claris Gladden saw you today and didn't think you were having an acute cardiac problem today. Call Dr Claris Gladden office to get an appointment to recheck you in the next week. Monitor your blood pressure at home daily to see if your blood pressure is trending high, although it improved today in the ED.

## 2013-09-05 NOTE — Consult Note (Signed)
HPI: 53 year old male for evaluation of presyncope. Patient is followed by Dr Aundra Dubin. Patient had a cardiac catheterization in 1999 showed a 50% ostial LAD. Followup cath the next year showed a 30% lesion. Nuclear study in October of 2013 showed no ischemia. Holter monitor in October of 2013 showed PACs and PVCs. Patient typically does not have dyspnea on exertion, orthopnea, PND, pedal edema, palpitations, syncope or exertional chest pain. He was at work this morning. He stood and was walking and developed dizziness/near syncope. He sat down and did not have frank syncope. There is no associated chest pain, palpitations or dyspnea. His blood pressure was checked and was 190/110 by report. His pulse was in the low 60s. He later developed nausea but no vomiting. His episode lasted approximately 45 minutes. He presented to the emergency room and cardiology asked to evaluate. Presently asymptomatic and blood pressure much improved   (Not in a hospital admission)  Allergies  Allergen Reactions  . Ciprofloxacin Diarrhea and Nausea And Vomiting    Hypotension  . Lactated Ringers Other (See Comments)    Really bad muscle spasms  . Tincture Of Benzoin [Benzoin Compound] Rash    Past Medical History  Diagnosis Date  . Hyperlipidemia   . Sleep apnea     no CPAP use - denies apnea, waking up gasping/choking, denies daytime sleepiness  . Arthritis     lower back  . Kidney stones   . Lipoma     on back  . Coronary artery disease   . Hypertension     Past Surgical History  Procedure Laterality Date  . Orif finger / thumb fracture  2002    right  . Fracture surgery  1974    right leg  . Kidney stone surgery  2006  . Cystoscopy/retrograde/ureteroscopy/stone extraction with basket  1/02, 12/01  . Shoulder arthroscopy w/ subacromial decompression and distal clavicle excision  7/08    left  . Cardiac catheterization  > 10 yrs.  . Lipoma excision  05/14/2011    Procedure: EXCISION LIPOMA;   Surgeon: Belva Crome, MD;  Location: [facility];  Service: General;  Laterality: Right;  EXCISION RIGHT UPPER BACK LIPOMA    History   Social History  . Marital Status: Married    Spouse Name: N/A    Number of Children: 2  . Years of Education: N/A   Occupational History  . 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 Smoking status: Light Tobacco Smoker -- 38 years    Types: Cigars  . Smokeless tobacco: Never Used     Comment: maybe 2 cigars weekly  . Alcohol Use: Yes     Comment: socially  . Drug Use: No  . Sexual Activity: Yes   Other Topics Concern  . Not on file   Social History Narrative  . No narrative on file    Family History  Problem Relation Age of Onset  . Anesthesia problems Mother     effects lasted for days  . Heart disease Mother   . Heart disease Father   . Heart disease Sister   . Lung cancer Maternal Grandfather   . Heart disease Paternal Grandmother   . Heart disease Paternal Grandfather     ROS:  no fevers or chills, productive cough, hemoptysis, dysphasia, odynophagia, melena, hematochezia, dysuria, hematuria, rash, seizure activity, orthopnea, PND, pedal edema, claudication. Remaining systems are negative.  Physical Exam:   Blood pressure 130/79,  pulse 62, temperature 97.4 F (36.3 C), temperature source Oral, resp. rate 17, height $RemoveBe'5\' 10"'zzVgsdEay$  (1.778 m), weight 215 lb (97.523 kg), SpO2 96.00%.  General:  Well developed/well nourished in NAD Skin warm/dry, tattoos Patient not depressed No peripheral clubbing Back-normal HEENT-normal/normal eyelids Neck supple/normal carotid upstroke bilaterally; no bruits; no JVD; no thyromegaly chest - CTA/ normal expansion CV - RRR/normal S1 and S2; no murmurs, rubs or gallops;  PMI nondisplaced, no change with Valsalva Abdomen -NT/ND, no HSM, no mass, + bowel sounds, no bruit 2+ femoral pulses, no bruits Ext-no edema, chords, 2+ DP Neuro-grossly nonfocal  ECG sinus  rhythm with no ST changes.    Results for orders placed during the hospital encounter of 09/05/13 (from the past 48 hour(s))  URINALYSIS, ROUTINE W REFLEX MICROSCOPIC     Status: None   Collection Time    09/05/13 10:03 AM      Result Value Ref Range   Color, Urine YELLOW  YELLOW   APPearance CLEAR  CLEAR   Specific Gravity, Urine 1.009  1.005 - 1.030   pH 7.0  5.0 - 8.0   Glucose, UA NEGATIVE  NEGATIVE mg/dL   Hgb urine dipstick NEGATIVE  NEGATIVE   Bilirubin Urine NEGATIVE  NEGATIVE   Ketones, ur NEGATIVE  NEGATIVE mg/dL   Protein, ur NEGATIVE  NEGATIVE mg/dL   Urobilinogen, UA 0.2  0.0 - 1.0 mg/dL   Nitrite NEGATIVE  NEGATIVE   Leukocytes, UA NEGATIVE  NEGATIVE   Comment: MICROSCOPIC NOT DONE ON URINES WITH NEGATIVE PROTEIN, BLOOD, LEUKOCYTES, NITRITE, OR GLUCOSE <1000 mg/dL.  CBC WITH DIFFERENTIAL     Status: None   Collection Time    09/05/13 10:25 AM      Result Value Ref Range   WBC 6.7  4.0 - 10.5 K/uL   RBC 5.15  4.22 - 5.81 MIL/uL   Hemoglobin 15.3  13.0 - 17.0 g/dL   HCT 45.2  39.0 - 52.0 %   MCV 87.8  78.0 - 100.0 fL   MCH 29.7  26.0 - 34.0 pg   MCHC 33.8  30.0 - 36.0 g/dL   RDW 13.4  11.5 - 15.5 %   Platelets 171  150 - 400 K/uL   Neutrophils Relative % 52  43 - 77 %   Neutro Abs 3.5  1.7 - 7.7 K/uL   Lymphocytes Relative 34  12 - 46 %   Lymphs Abs 2.3  0.7 - 4.0 K/uL   Monocytes Relative 10  3 - 12 %   Monocytes Absolute 0.7  0.1 - 1.0 K/uL   Eosinophils Relative 3  0 - 5 %   Eosinophils Absolute 0.2  0.0 - 0.7 K/uL   Basophils Relative 1  0 - 1 %   Basophils Absolute 0.0  0.0 - 0.1 K/uL  COMPREHENSIVE METABOLIC PANEL     Status: None   Collection Time    09/05/13 10:25 AM      Result Value Ref Range   Sodium 141  137 - 147 mEq/L   Potassium 4.4  3.7 - 5.3 mEq/L   Chloride 104  96 - 112 mEq/L   CO2 24  19 - 32 mEq/L   Glucose, Bld 94  70 - 99 mg/dL   BUN 23  6 - 23 mg/dL   Creatinine, Ser 0.82  0.50 - 1.35 mg/dL   Calcium 9.3  8.4 - 10.5 mg/dL    Total Protein 6.9  6.0 - 8.3 g/dL   Albumin 3.9  3.5 - 5.2 g/dL   AST 19  0 - 37 U/L   ALT 26  0 - 53 U/L   Alkaline Phosphatase 85  39 - 117 U/L   Total Bilirubin 0.6  0.3 - 1.2 mg/dL   GFR calc non Af Amer >90  >90 mL/min   GFR calc Af Amer >90  >90 mL/min   Comment: (NOTE)     The eGFR has been calculated using the CKD EPI equation.     This calculation has not been validated in all clinical situations.     eGFR's persistently <90 mL/min signify possible Chronic Kidney     Disease.  TROPONIN I     Status: None   Collection Time    09/05/13 10:25 AM      Result Value Ref Range   Troponin I <0.30  <0.30 ng/mL   Comment:            Due to the release kinetics of cTnI,     a negative result within the first hours     of the onset of symptoms does not rule out     myocardial infarction with certainty.     If myocardial infarction is still suspected,     repeat the test at appropriate intervals.    Dg Chest Portable 1 View  09/05/2013   CLINICAL DATA:  Near syncope, dizziness  EXAM: PORTABLE CHEST - 1 VIEW  COMPARISON:  04/22/2012  FINDINGS: Lungs are essentially clear. Possible mild left basilar atelectasis. No pleural effusion or pneumothorax.  The heart is normal in size.  IMPRESSION: No evidence of acute cardiopulmonary disease.   Electronically Signed   By: Julian Hy M.D.   On: 09/05/2013 10:34    Assessment/Plan 1 near syncope-etiology is unclear. His electrocardiogram is normal. His initial troponin is normal. He did not have associated chest pain, palpitations, dyspnea or syncope. I discussed admission for observation versus discharge and he would prefer the latter. I think this is reasonable if second troponin negative. We will ask him to followup with Dr. Aundra Dubin. Further episodes could be evaluated with a monitor. 2 History of nonobstructive coronary disease-the patient denies chest pain and his electrocardiogram is normal. Initial troponins are normal. If followup  troponin normal would not pursue further ischemia evaluation. Continue aspirin, statin and beta blocker. 3 hypertension-continue present medications. Blood pressure now normalized. 4 hyperlipidemia-continue statin. 5 history of palpitations-continue beta blocker.   Kirk Ruths MD 09/05/2013, 1:17 PM

## 2013-09-05 NOTE — ED Provider Notes (Signed)
CSN: [id number]     Arrival date & time 09/05/13  9323 History   First MD Initiated Contact with Patient 09/05/13 [phone]     Chief Complaint  Patient presents with  . Hypertension     (Consider location/radiation/quality/duration/timing/severity/associated sxs/prior Treatment) HPI Patient reports when he got to work today he felt fine. However he got up to walk to the back of the office and he felt like he was going to pass out. He had nausea without vomiting. He denies any spinning sensation. He states bending it made him feel worse, sitting down made him feel better. He had his blood pressure taken it was 190/120 with a pulse of 60. He states his pulse is normally 80. He was not pale, there was no diaphoresis. He denies shortness of breath. He states he's never felt this way before. Patient states he had some tingling in his left great toe for the last couple days. That has gone away today however he feels like his left foot is numb although he can feel himself touching it. He stated as normal color. He walked normally without limping. He states he noted his hands seem to be swelling a few days ago. He denies any swelling in his feet or ankles. He denies chest pain. He states since his episode he has mild nausea and a headache that is frontal and steady without throbbing. He has no visual changes.  Patient states he had a cardiac cath over 10 years ago which showed he had a "widow maker". It was not amenable to stenting and he changed his diet and was medications. He reports a repeat cath showed the lesion was improving. He gets a stress test every 18 months that have been fine.  PCP none Cardiology Dr Shirlee Latch  Past Medical History  Diagnosis Date  . Hyperlipidemia   . Sleep apnea     no CPAP use - denies apnea, waking up gasping/choking, denies daytime sleepiness  . Arthritis     lower back  . Kidney stones   . Lipoma     on back  . Coronary artery disease   . Hypertension    Past  Surgical History  Procedure Laterality Date  . Orif finger / thumb fracture  2002    right  . Fracture surgery  1974    right leg  . Kidney stone surgery  2006  . Cystoscopy/retrograde/ureteroscopy/stone extraction with basket  1/02, 12/01  . Shoulder arthroscopy w/ subacromial decompression and distal clavicle excision  7/08    left  . Cardiac catheterization  > 10 yrs.  . Lipoma excision  05/14/2011    Procedure: EXCISION LIPOMA;  Surgeon: Jetty Duhamel, MD;  Location: [facility];  Service: General;  Laterality: Right;  EXCISION RIGHT UPPER BACK LIPOMA   Family History  Problem Relation Age of Onset  . Anesthesia problems Mother     effects lasted for days  . Heart disease Mother   . Heart disease Father   . Heart disease Sister   . Lung cancer Maternal Grandfather   . Heart disease Paternal Grandmother   . Heart disease Paternal Grandfather    History  Substance Use Topics  . Smoking status: Light Tobacco Smoker -- 38 years    Types: Cigars  . Smokeless tobacco: Never Used     Comment: maybe 2 cigars weekly  . Alcohol Use: Yes     Comment: socially  lives at home employed  Review of Systems  All other systems reviewed and are negative.      Allergies  Ciprofloxacin; Lactated ringers; and Tincture of benzoin  Home Medications   Current Outpatient Rx  Name  Route  Sig  Dispense  Refill  . aspirin EC 81 MG tablet   Oral   Take 81 mg by mouth daily.         Marland Kitchen atorvastatin (LIPITOR) 40 MG tablet   Oral   Take 40 mg by mouth at bedtime.         . celecoxib (CELEBREX) 200 MG capsule   Oral   Take 200 mg by mouth 2 (two) times daily.         . fish oil-omega-3 fatty acids 1000 MG capsule   Oral   Take 2,000 mg by mouth 2 (two) times daily.          . metoprolol tartrate (LOPRESSOR) 25 MG tablet   Oral   Take 1 tablet (25 mg total) by mouth 2 (two) times daily.   180 tablet   3    BP 130/79  Pulse 62  Temp(Src) 97.4 F  (36.3 C) (Oral)  Resp 17  Ht 5\' 10"  (1.778 m)  Wt 215 lb (97.523 kg)  BMI 30.85 kg/m2  SpO2 96%  Vital signs normal    Physical Exam  Nursing note and vitals reviewed. Constitutional: He is oriented to person, place, and time. He appears well-developed and well-nourished.  Non-toxic appearance. He does not appear ill. No distress.  HENT:  Head: Normocephalic and atraumatic.  Right Ear: External ear normal.  Left Ear: External ear normal.  Nose: Nose normal. No mucosal edema or rhinorrhea.  Mouth/Throat: Oropharynx is clear and moist and mucous membranes are normal. No dental abscesses or uvula swelling.  Eyes: Conjunctivae and EOM are normal. Pupils are equal, round, and reactive to light.  Neck: Normal range of motion and full passive range of motion without pain. Neck supple.  Cardiovascular: Normal rate, regular rhythm and normal heart sounds.  Exam reveals no gallop and no friction rub.   No murmur heard. Pulmonary/Chest: Effort normal and breath sounds normal. No respiratory distress. He has no wheezes. He has no rhonchi. He has no rales. He exhibits no tenderness and no crepitus.  Abdominal: Soft. Normal appearance and bowel sounds are normal. He exhibits no distension. There is no tenderness. There is no rebound and no guarding.  Musculoskeletal: Normal range of motion. He exhibits no edema and no tenderness.  Moves all extremities well.  Lateral feet are cool to touch however he has strong dorsalis pedis pulses. Patient states he had a Doppler arterial study done about a year ago that was normal.  Neurological: He is alert and oriented to person, place, and time. He has normal strength. No cranial nerve deficit.  Skin: Skin is warm, dry and intact. No rash noted. No erythema. No pallor.  Psychiatric: He has a normal mood and affect. His speech is normal and behavior is normal. His mood appears not anxious.    ED Course  Procedures (including critical care time)  12:22  Trish, [facility], will have someone come evaluate patient.   Pt has been evaluated by Dr Stanford Breed, feels if second troponin is negative (which it is) can be discharged.    Labs Review  Results for orders placed during the hospital encounter of 09/05/13  CBC WITH DIFFERENTIAL      Result Value Ref Range   WBC 6.7  4.0 - 10.5 K/uL  RBC 5.15  4.22 - 5.81 MIL/uL   Hemoglobin 15.3  13.0 - 17.0 g/dL   HCT 45.2  39.0 - 52.0 %   MCV 87.8  78.0 - 100.0 fL   MCH 29.7  26.0 - 34.0 pg   MCHC 33.8  30.0 - 36.0 g/dL   RDW 13.4  11.5 - 15.5 %   Platelets 171  150 - 400 K/uL   Neutrophils Relative % 52  43 - 77 %   Neutro Abs 3.5  1.7 - 7.7 K/uL   Lymphocytes Relative 34  12 - 46 %   Lymphs Abs 2.3  0.7 - 4.0 K/uL   Monocytes Relative 10  3 - 12 %   Monocytes Absolute 0.7  0.1 - 1.0 K/uL   Eosinophils Relative 3  0 - 5 %   Eosinophils Absolute 0.2  0.0 - 0.7 K/uL   Basophils Relative 1  0 - 1 %   Basophils Absolute 0.0  0.0 - 0.1 K/uL  COMPREHENSIVE METABOLIC PANEL      Result Value Ref Range   Sodium 141  137 - 147 mEq/L   Potassium 4.4  3.7 - 5.3 mEq/L   Chloride 104  96 - 112 mEq/L   CO2 24  19 - 32 mEq/L   Glucose, Bld 94  70 - 99 mg/dL   BUN 23  6 - 23 mg/dL   Creatinine, Ser 0.82  0.50 - 1.35 mg/dL   Calcium 9.3  8.4 - 10.5 mg/dL   Total Protein 6.9  6.0 - 8.3 g/dL   Albumin 3.9  3.5 - 5.2 g/dL   AST 19  0 - 37 U/L   ALT 26  0 - 53 U/L   Alkaline Phosphatase 85  39 - 117 U/L   Total Bilirubin 0.6  0.3 - 1.2 mg/dL   GFR calc non Af Amer >90  >90 mL/min   GFR calc Af Amer >90  >90 mL/min  TROPONIN I      Result Value Ref Range   Troponin I <0.30  <0.30 ng/mL  URINALYSIS, ROUTINE W REFLEX MICROSCOPIC      Result Value Ref Range   Color, Urine YELLOW  YELLOW   APPearance CLEAR  CLEAR   Specific Gravity, Urine 1.009  1.005 - 1.030   pH 7.0  5.0 - 8.0   Glucose, UA NEGATIVE  NEGATIVE mg/dL   Hgb urine dipstick NEGATIVE  NEGATIVE   Bilirubin Urine NEGATIVE  NEGATIVE    Ketones, ur NEGATIVE  NEGATIVE mg/dL   Protein, ur NEGATIVE  NEGATIVE mg/dL   Urobilinogen, UA 0.2  0.0 - 1.0 mg/dL   Nitrite NEGATIVE  NEGATIVE   Leukocytes, UA NEGATIVE  NEGATIVE  TROPONIN I      Result Value Ref Range   Troponin I <0.30  <0.30 ng/mL   Laboratory interpretation all normal      Imaging Review Dg Chest Portable 1 View  09/05/2013   CLINICAL DATA:  Near syncope, dizziness  EXAM: PORTABLE CHEST - 1 VIEW  COMPARISON:  04/22/2012  FINDINGS: Lungs are essentially clear. Possible mild left basilar atelectasis. No pleural effusion or pneumothorax.  The heart is normal in size.  IMPRESSION: No evidence of acute cardiopulmonary disease.   Electronically Signed   By: Julian Hy M.D.   On: 09/05/2013 10:34     EKG Interpretation   Date/Time:  Wednesday September 05 2013 08:46:17 EST Ventricular Rate:  63 PR Interval:  152 QRS Duration: 92 QT Interval:  394 QTC Calculation: 403 R Axis:   45 Text Interpretation:  Normal sinus rhythm Possible Anterior infarct , age  undetermined Since last tracing rate slower Confirmed by Laisha Rau  MD-I, Rex Magee  ([id number]) on 09/05/2013 12:16:59 PM      MDM   Final diagnoses:  Dizziness    Plan discharge   Rolland Porter, MD, Alanson Aly, MD 09/05/13 1535

## 2013-09-05 NOTE — ED Notes (Signed)
About 45 mins ago got up from desk got syncopal and got nauseated and bp was high 190/120 and still high in other arm has a h/a nauseated

## 2013-09-21 ENCOUNTER — Other Ambulatory Visit: Payer: 59

## 2013-09-26 ENCOUNTER — Encounter: Payer: 59 | Admitting: Physician Assistant

## 2013-09-26 ENCOUNTER — Other Ambulatory Visit (INDEPENDENT_AMBULATORY_CARE_PROVIDER_SITE_OTHER): Payer: 59

## 2013-09-26 DIAGNOSIS — I2510 Atherosclerotic heart disease of native coronary artery without angina pectoris: Secondary | ICD-10-CM

## 2013-09-26 DIAGNOSIS — E785 Hyperlipidemia, unspecified: Secondary | ICD-10-CM

## 2013-09-26 LAB — LIPID PANEL
Cholesterol: 148 mg/dL (ref 0–200)
HDL: 46.2 mg/dL (ref >39.00)
LDL Cholesterol: 60 mg/dL (ref 0–99)
TRIGLYCERIDES: 209 mg/dL — AB (ref 0.0–149.0)
Total CHOL/HDL Ratio: 3
VLDL: 41.8 mg/dL — AB (ref 0.0–40.0)

## 2013-09-26 LAB — HEPATIC FUNCTION PANEL
ALBUMIN: 3.9 g/dL (ref 3.5–5.2)
ALT: 27 U/L (ref 0–53)
AST: 20 U/L (ref 0–37)
Alkaline Phosphatase: 84 U/L (ref 39–117)
Bilirubin, Direct: 0.1 mg/dL (ref 0.0–0.3)
TOTAL PROTEIN: 6.3 g/dL (ref 6.0–8.3)
Total Bilirubin: 0.6 mg/dL (ref 0.3–1.2)

## 2013-10-28 ENCOUNTER — Encounter: Payer: Self-pay | Admitting: Cardiology

## 2013-10-29 ENCOUNTER — Other Ambulatory Visit: Payer: Self-pay

## 2013-10-29 MED ORDER — ATORVASTATIN CALCIUM 40 MG PO TABS: 40.0000 mg | ORAL_TABLET | Freq: Every day | ORAL | Status: DC

## 2013-12-14 ENCOUNTER — Other Ambulatory Visit: Payer: Self-pay | Admitting: *Deleted

## 2013-12-14 MED ORDER — ATORVASTATIN CALCIUM 40 MG PO TABS: 40.0000 mg | ORAL_TABLET | Freq: Every day | ORAL | Status: DC

## 2014-02-18 IMAGING — CR DG CHEST 2V
2 series · 2 of 2 positions shown · non-contrast
Comparison: 01/17/2007

CLINICAL DATA: Chest pain, palpitations

CHEST - 2 VIEW

[w chest pa]
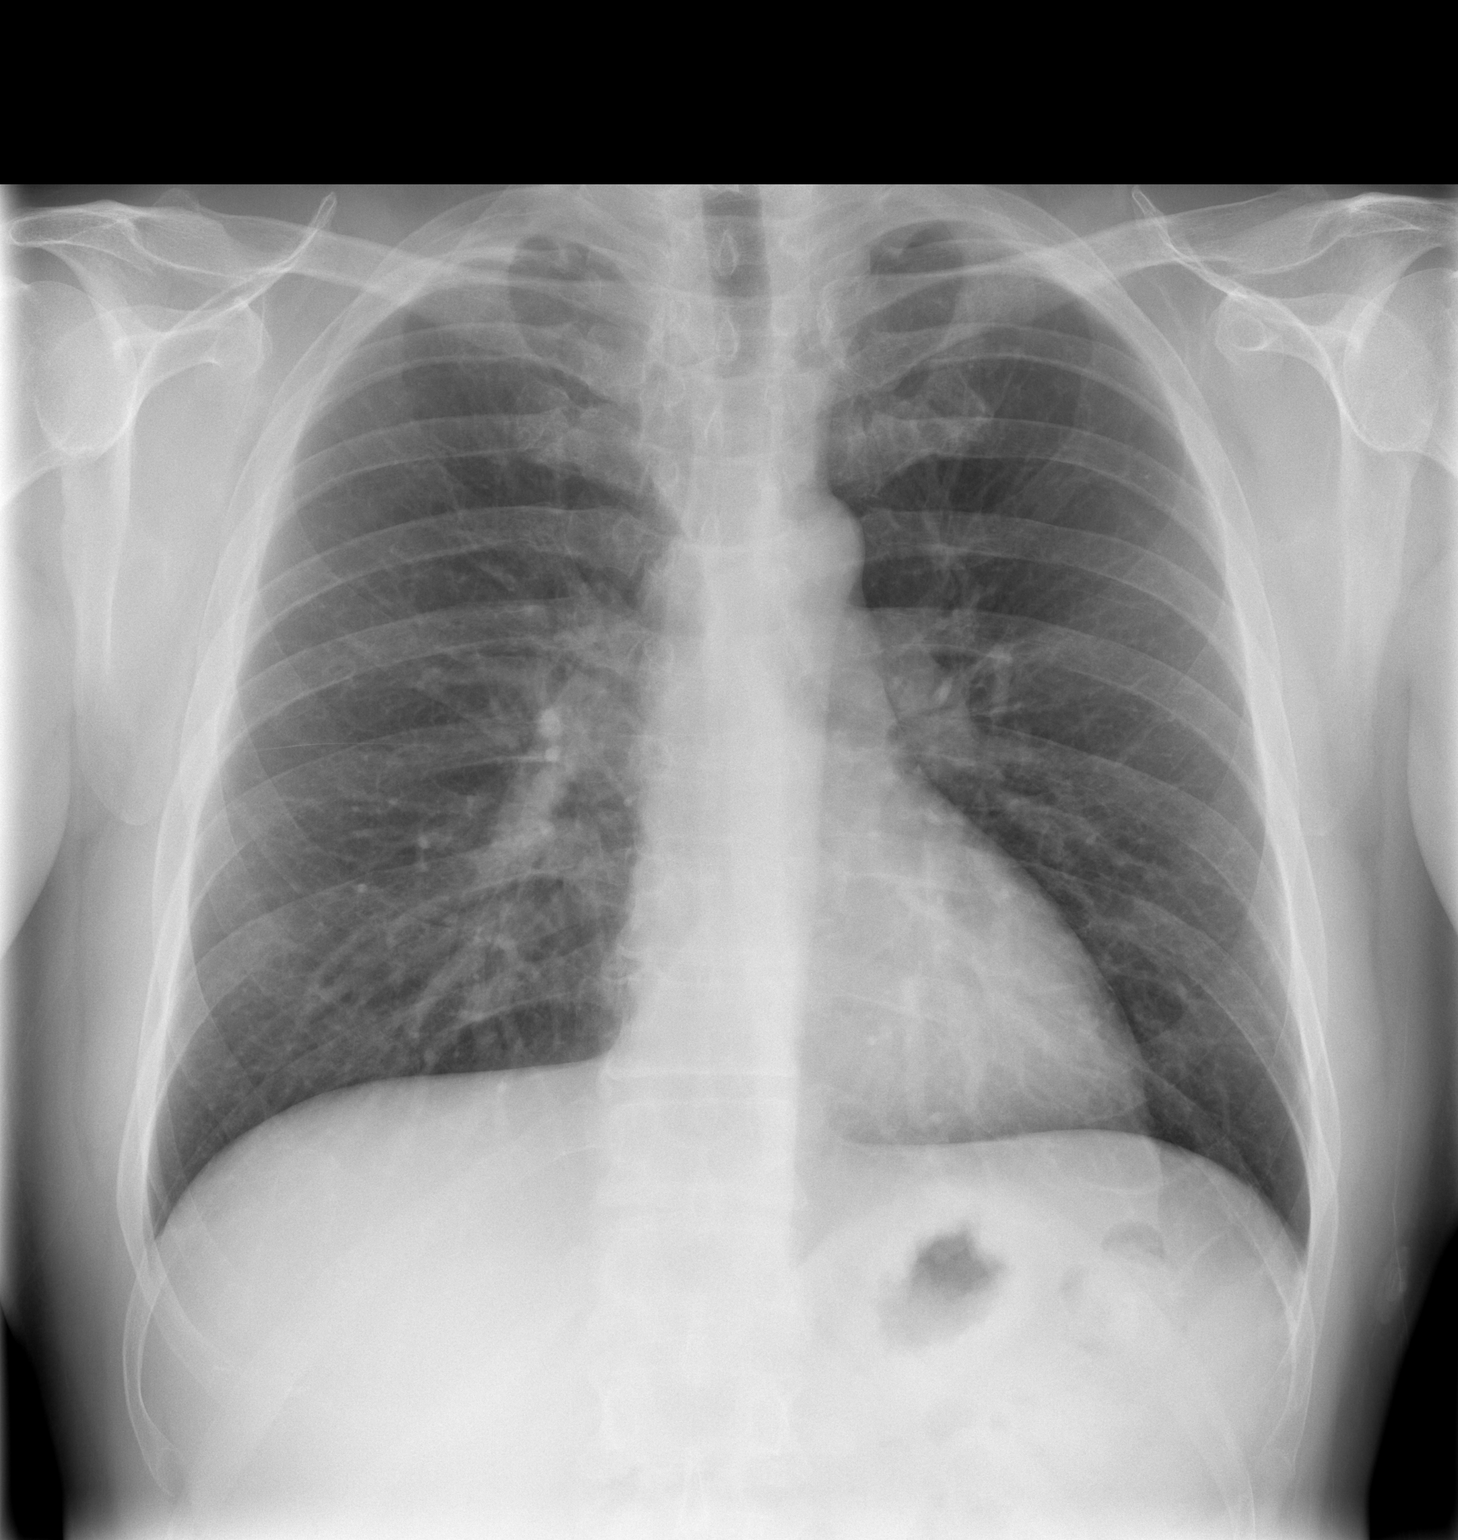

[w chest lat]
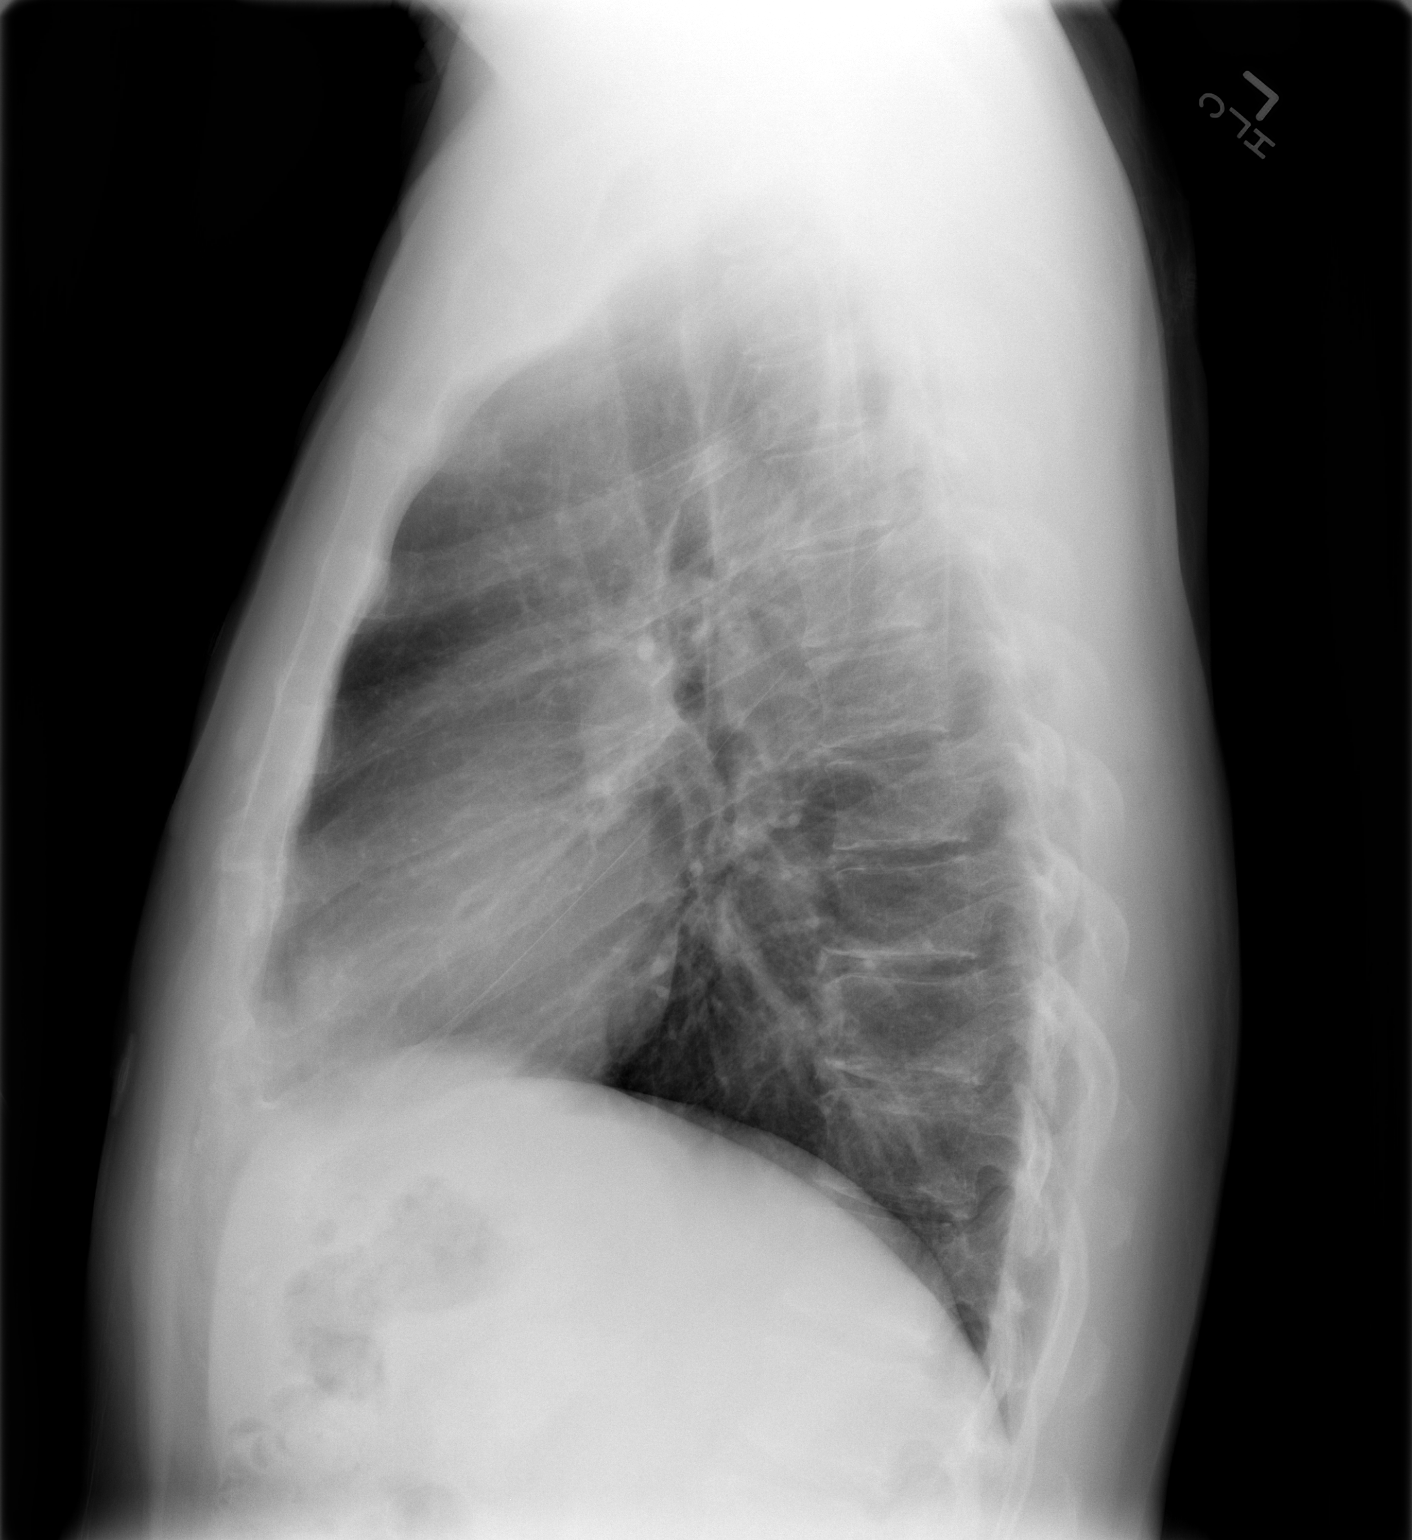

[2 of 2 positions shown; findings below may reference images not displayed]

FINDINGS: Cardiomediastinal silhouette is stable.  No acute
infiltrate or pleural effusion.  No pulmonary edema.  Bony thorax
is stable.
IMPRESSION: No active disease.  No significant change.

## 2014-06-30 ENCOUNTER — Other Ambulatory Visit: Payer: Self-pay | Admitting: Cardiology

## 2014-07-01 NOTE — Telephone Encounter (Signed)
Rx(s) sent to pharmacy electronically.  

## 2014-07-03 ENCOUNTER — Other Ambulatory Visit: Payer: Self-pay | Admitting: Cardiology

## 2014-07-22 ENCOUNTER — Telehealth: Payer: Self-pay | Admitting: Cardiology

## 2014-07-22 DIAGNOSIS — E785 Hyperlipidemia, unspecified: Secondary | ICD-10-CM

## 2014-07-22 NOTE — Telephone Encounter (Signed)
BMET and lipids needed.

## 2014-07-22 NOTE — Telephone Encounter (Signed)
LMTCB

## 2014-07-22 NOTE — Telephone Encounter (Signed)
Will forward to Dr Aundra Dubin for lab orders prior to appt with Dr Aundra Dubin in February 2016.

## 2014-07-22 NOTE — Telephone Encounter (Signed)
PT CALLED TO MAKE 1 YEAR FU APPT, THINKS HE MAY NEED BLOOD WORK PRIOR?

## 2014-07-22 NOTE — Telephone Encounter (Signed)
Pt advised,lab scheduled for 08/14/14

## 2014-08-14 ENCOUNTER — Other Ambulatory Visit (INDEPENDENT_AMBULATORY_CARE_PROVIDER_SITE_OTHER): Payer: 59 | Admitting: *Deleted

## 2014-08-14 DIAGNOSIS — E785 Hyperlipidemia, unspecified: Secondary | ICD-10-CM

## 2014-08-14 LAB — LIPID PANEL
CHOLESTEROL: 162 mg/dL (ref 0–200)
HDL: 52 mg/dL (ref >39.00)
LDL Cholesterol: 75 mg/dL (ref 0–99)
NonHDL: 110
Total CHOL/HDL Ratio: 3
Triglycerides: 174 mg/dL — ABNORMAL HIGH (ref 0.0–149.0)
VLDL: 34.8 mg/dL (ref 0.0–40.0)

## 2014-08-14 LAB — BASIC METABOLIC PANEL
BUN: 22 mg/dL (ref 6–23)
CHLORIDE: 104 meq/L (ref 96–112)
CO2: 29 meq/L (ref 19–32)
Calcium: 9.2 mg/dL (ref 8.4–10.5)
Creatinine, Ser: 0.92 mg/dL (ref 0.40–1.50)
GFR: 90.43 mL/min (ref >60.00)
GLUCOSE: 102 mg/dL — AB (ref 70–99)
POTASSIUM: 4.3 meq/L (ref 3.5–5.1)
SODIUM: 138 meq/L (ref 135–145)

## 2014-08-14 NOTE — Addendum Note (Signed)
Addended by: Eulis Foster on: 08/14/2014 08:07 AM   Modules accepted: Orders

## 2014-08-19 ENCOUNTER — Ambulatory Visit: Payer: Self-pay | Admitting: Cardiology

## 2014-09-06 ENCOUNTER — Other Ambulatory Visit: Payer: Self-pay | Admitting: Cardiology

## 2014-09-19 ENCOUNTER — Ambulatory Visit (INDEPENDENT_AMBULATORY_CARE_PROVIDER_SITE_OTHER): Payer: 59 | Admitting: Cardiology

## 2014-09-19 ENCOUNTER — Encounter: Payer: Self-pay | Admitting: Cardiology

## 2014-09-19 VITALS — BP 124/70 | HR 68 | Ht 70.0 in | Wt 211.0 lb

## 2014-09-19 DIAGNOSIS — R002 Palpitations: Secondary | ICD-10-CM

## 2014-09-19 DIAGNOSIS — E785 Hyperlipidemia, unspecified: Secondary | ICD-10-CM

## 2014-09-19 DIAGNOSIS — I2510 Atherosclerotic heart disease of native coronary artery without angina pectoris: Secondary | ICD-10-CM

## 2014-09-19 MED ORDER — ATORVASTATIN CALCIUM 40 MG PO TABS: ORAL_TABLET | ORAL | Status: DC

## 2014-09-19 NOTE — Patient Instructions (Addendum)
Your physician wants you to follow-up in: 1 year with Dr Aundra Dubin. (March 2017). You will receive a reminder letter in the mail two months in advance. If you don't receive a letter, please call our office to schedule the follow-up appointment.

## 2014-09-19 NOTE — Progress Notes (Signed)
Patient ID: Steven Henson, male   DOB: 08-Mar-1961, 54 y.o.   MRN: [id number] 54 yo with history CAD and hyperlipidemia presents for cardiology followup.  He had an episode of chest pain in 1999 and had a cath showing 50% ostial LAD stenosis.  This was managed medically. He had another cath the next year showing 30% ostial LAD stenosis.  No ischemia on ETT-Myoview in 10/13.  Patient developed an increase in palpitations in 12/14.  He came by the office and was noted to have PACs.    He was started on metoprolol 25 mg bid.  This seemed to resolve his palpitations.  No chest pain, no exertional dyspnea.  He continues to smoke a couple of cigars/week. He walks 1 mile/day.   Labs (10/13): Troponin negative x 2, K 4.1, creatinine 0.84 Labs (2/16): K 4.3, creatinine 0.92, LDL 75, HDL 52  ECG: NSR, inferior TWIs  PMH: 1. Nephrolithiasis 2. H/o shoulder surgery 3. Hyperlipidemia 4. OSA: Not on CPAP 5. Low back pain 6. CAD: LHC (1999) with 50% ostial LAD stenosis.  LHC (2000) with 30% ostial LAD stenosis.  Myoview (11/10): Low risk.  ETT-myoview (10/13) with 5'45" exercise, stopped due to dypsnea, no ischemia or infarction on perfusion images but there were ECG changes.  EF 53%.   7. Palpitations: Holter (10/13) with rare PACs and PVCs. 8. 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  He is married.  Smokes 2 cigars/week.    FH: Father with CAD, MI in his 41s.    Current Outpatient Prescriptions  Medication Sig Dispense Refill  . aspirin EC 81 MG tablet Take 81 mg by mouth daily.    Marland Kitchen atorvastatin (LIPITOR) 40 MG tablet Take 1 tablet by mouth at  bedtime 90 tablet 3  . celecoxib (CELEBREX) 200 MG capsule Take 200 mg by mouth 2 (two) times daily.    . fish oil-omega-3 fatty acids 1000 MG capsule Take 2,000 mg by mouth 2 (two) times daily.     . metoprolol tartrate (LOPRESSOR) 25 MG tablet Take 1 tablet (25 mg total) by mouth 2 (two) times daily. <please make appointment for future refills> 180  tablet 0   No current facility-administered medications for this visit.    BP 124/70 mmHg  Pulse 68  Ht 5\' 10"  (1.778 m)  Wt 211 lb (95.709 kg)  BMI 30.28 kg/m2 General: NAD Neck: No JVD, no thyromegaly or thyroid nodule.  Lungs: Clear to auscultation bilaterally with normal respiratory effort. CV: Nondisplaced PMI.  Heart regular S1/S2, no S3/S4, no murmur.  No peripheral edema.  No carotid bruit.  Normal pedal pulses.  Abdomen: Soft, nontender, no hepatosplenomegaly, no distention.  Neurologic: Alert and oriented x 3.  Psych: Normal affect. Extremities: No clubbing or cyanosis.   Assessment/Plan: 1. CAD: Nonobstructive on prior caths. No further chest pain. Continue ASA 81 and statin.  2. Hyperlipidemia: Good lipids in 2/16.  3. Blood pressure: BP seems under reasonable control.  4. Palpitations: PACs/PVCs, symptomatic at times.  Symptoms resolved with metoprolol.  Continue metoprolol.   5. Smoking: I asked him to cut out cigars all together.    Followup in 1 year if no further symptoms.   Loralie Champagne 09/19/2014

## 2014-11-08 ENCOUNTER — Other Ambulatory Visit: Payer: Self-pay | Admitting: Cardiology

## 2014-11-12 ENCOUNTER — Other Ambulatory Visit: Payer: Self-pay

## 2015-02-11 ENCOUNTER — Encounter: Payer: Self-pay | Admitting: Cardiology

## 2015-03-11 ENCOUNTER — Encounter: Payer: Self-pay | Admitting: Cardiology

## 2015-05-01 ENCOUNTER — Telehealth (HOSPITAL_COMMUNITY): Payer: Self-pay

## 2015-05-01 NOTE — Telephone Encounter (Signed)
Received Influenza vaccine at [facility] # [address] [place]

## 2015-09-16 ENCOUNTER — Other Ambulatory Visit: Payer: Self-pay | Admitting: Cardiology

## 2015-09-16 NOTE — Telephone Encounter (Signed)
Medication Detail      Disp Refills Start End     metoprolol tartrate (LOPRESSOR) 25 MG tablet 180 tablet 3 11/08/2014     Sig: Take 1 tablet by mouth twice a day    E-Prescribing Status: Receipt confirmed by pharmacy (11/08/2014 2:11 PM EDT)     Pharmacy    [facility], [place]

## 2015-10-08 ENCOUNTER — Other Ambulatory Visit: Payer: Self-pay | Admitting: Cardiology

## 2015-11-12 ENCOUNTER — Other Ambulatory Visit: Payer: Self-pay | Admitting: Gastroenterology

## 2015-12-28 ENCOUNTER — Other Ambulatory Visit: Payer: Self-pay | Admitting: Cardiology

## 2015-12-30 NOTE — Telephone Encounter (Signed)
Steven Henson  10/08/2015  Refill  MRN:  [id number]   Description: 55 year old male  Provider: Larey Dresser, MD  Department: [facility] [facility]       Call Documentation     No notes of this type exist for this encounter.     Encounter MyChart Messages     No messages in this encounter     Approved      Disp Refills Start End    metoprolol tartrate (LOPRESSOR) 25 MG tablet 180 tablet 0 10/08/2015     Sig:  Take 1 tablet by mouth twice a day    Class:  Normal    DAW:  No    Comment:  Please call and schedule a one year follow up appointment    Authorizing Provider:  Larey Dresser, MD    Ordering User:  Juventino Slovak, [place], [place]

## 2016-01-08 ENCOUNTER — Other Ambulatory Visit: Payer: Self-pay | Admitting: Cardiology

## 2016-11-10 DIAGNOSIS — R350 Frequency of micturition: Secondary | ICD-10-CM | POA: Diagnosis not present

## 2016-11-10 DIAGNOSIS — N401 Enlarged prostate with lower urinary tract symptoms: Secondary | ICD-10-CM | POA: Diagnosis not present

## 2016-11-10 DIAGNOSIS — N200 Calculus of kidney: Secondary | ICD-10-CM | POA: Diagnosis not present

## 2017-01-25 DIAGNOSIS — Z0000 Encounter for general adult medical examination without abnormal findings: Secondary | ICD-10-CM | POA: Diagnosis not present

## 2017-01-25 DIAGNOSIS — I10 Essential (primary) hypertension: Secondary | ICD-10-CM | POA: Diagnosis not present

## 2017-01-25 DIAGNOSIS — E7800 Pure hypercholesterolemia, unspecified: Secondary | ICD-10-CM | POA: Diagnosis not present

## 2017-01-27 ENCOUNTER — Telehealth: Payer: Self-pay | Admitting: Acute Care

## 2017-01-27 NOTE — Telephone Encounter (Signed)
Spoke with pt to schedule SDMV and CT.  Pt would like to call [employer] to check his coverage for this.  Will await call back from pt.  Will close this message and refer to referral notes.

## 2017-01-31 ENCOUNTER — Telehealth: Payer: Self-pay | Admitting: Acute Care

## 2017-01-31 DIAGNOSIS — Z87891 Personal history of nicotine dependence: Secondary | ICD-10-CM

## 2017-01-31 DIAGNOSIS — Z122 Encounter for screening for malignant neoplasm of respiratory organs: Secondary | ICD-10-CM

## 2017-02-01 NOTE — Telephone Encounter (Signed)
Routing to lung nodule pool.  

## 2017-02-03 ENCOUNTER — Encounter: Payer: Self-pay | Admitting: *Deleted

## 2017-02-03 NOTE — Telephone Encounter (Signed)
Spoke with pt and scheduled for [facility] 03/09/17 9:30 CT ordered Nothing further needed

## 2017-02-20 DIAGNOSIS — M25572 Pain in left ankle and joints of left foot: Secondary | ICD-10-CM | POA: Diagnosis not present

## 2017-02-28 DIAGNOSIS — S86112D Strain of other muscle(s) and tendon(s) of posterior muscle group at lower leg level, left leg, subsequent encounter: Secondary | ICD-10-CM | POA: Diagnosis not present

## 2017-03-09 ENCOUNTER — Ambulatory Visit (INDEPENDENT_AMBULATORY_CARE_PROVIDER_SITE_OTHER): Payer: 59 | Admitting: Acute Care

## 2017-03-09 ENCOUNTER — Ambulatory Visit (INDEPENDENT_AMBULATORY_CARE_PROVIDER_SITE_OTHER)
Admission: RE | Admit: 2017-03-09 | Discharge: 2017-03-09 | Disposition: A | Discharge status: Home / Self Care | Payer: 59 | Source: Ambulatory Visit | Attending: Acute Care | Admitting: Acute Care

## 2017-03-09 ENCOUNTER — Encounter: Payer: 59 | Admitting: Acute Care

## 2017-03-09 ENCOUNTER — Encounter: Payer: Self-pay | Admitting: Acute Care

## 2017-03-09 DIAGNOSIS — D1801 Hemangioma of skin and subcutaneous tissue: Secondary | ICD-10-CM | POA: Diagnosis not present

## 2017-03-09 DIAGNOSIS — Z87891 Personal history of nicotine dependence: Secondary | ICD-10-CM

## 2017-03-09 DIAGNOSIS — L821 Other seborrheic keratosis: Secondary | ICD-10-CM | POA: Diagnosis not present

## 2017-03-09 DIAGNOSIS — L570 Actinic keratosis: Secondary | ICD-10-CM | POA: Diagnosis not present

## 2017-03-09 DIAGNOSIS — D225 Melanocytic nevi of trunk: Secondary | ICD-10-CM | POA: Diagnosis not present

## 2017-03-09 NOTE — Progress Notes (Signed)
Shared Decision Making Visit Lung Cancer Screening Program [phone])   Eligibility:  Age: 56 years  Pack Years Smoking History Calculation : 77.5 pack years smoking history (# packs/per year x # years smoked)  Recent History of coughing up blood  no  Unexplained weight loss? no ( >Than 15 pounds within the last 6 months )  Prior History Lung / other cancer no (Diagnosis within the last 5 years already requiring surveillance chest CT Scans).  Smoking Status Former Smoker  Former Smokers: Years since quit: 10 years  Quit Date: 2009  Visit Components:  Discussion included one or more decision making aids. yes  Discussion included risk/benefits of screening. yes  Discussion included potential follow up diagnostic testing for abnormal scans. yes  Discussion included meaning and risk of over diagnosis. yes  Discussion included meaning and risk of False Positives. yes  Discussion included meaning of total radiation exposure. yes  Counseling Included:  Importance of adherence to annual lung cancer LDCT screening. yes  Impact of comorbidities on ability to participate in the program. yes  Ability and willingness to under diagnostic treatment. yes  Smoking Cessation Counseling:  Current Smokers:   Discussed importance of smoking cessation. NA  Information about tobacco cessation classes and interventions provided to patient. yes  Patient provided with "ticket" for LDCT Scan. yes  Symptomatic Patient. no  Counseling  Diagnosis Code: Tobacco Use Z72.0  Asymptomatic Patient yes  Counseling (Intermediate counseling: > three minutes counseling) E9937  Former Smokers:   Discussed the importance of maintaining cigarette abstinence. yes  Diagnosis Code: Personal History of Nicotine Dependence. J69.678  Information about tobacco cessation classes and interventions provided to patient. Yes  Patient provided with "ticket" for LDCT Scan. yes  Written Order for Lung  Cancer Screening with LDCT placed in Epic. Yes (CT Chest Lung Cancer Screening Low Dose W/O CM) [id number] Z12.2-Screening of respiratory organs Z87.891-Personal history of nicotine dependence  I spent 25 minutes of face to face time with Mr. Kimmel discussing the risks and benefits of lung cancer screening. We viewed a power point together that explained in detail the above noted topics. We took the time to pause the power point at intervals to allow for questions to be asked and answered to ensure understanding. We discussed that he had taken the single most powerful action possible to decrease his risk of developing lung cancer when he quit smoking. I counseled Mr. Jacinto to remain smoke free, and to contact me if he ever had the desire to smoke again so that I can provide resources and tools to help support the effort to remain smoke free. We discussed the time and location of the scan, and that either  Doroteo Glassman RN or I will call with the results within  24-48 hours of receiving them. He has my card and contact information in the event he needs to speak with me, in addition to a copy of the power point we reviewed as a resource. Mr. Neeson verbalized understanding of all of the above and had no further questions upon leaving the office.     I explained to the patient that there has been a high incidence of coronary artery disease noted on these exams. I explained that this is a non-gated exam therefore degree or severity cannot be determined. This patient is currently on statin therapy. I have asked the patient to follow-up with their PCP regarding any incidental finding of coronary artery disease and management with diet or medication as they  feel is clinically indicated. The patient verbalized understanding of the above and had no further questions.      Magdalen Spatz, NP 03/09/2017

## 2017-03-11 ENCOUNTER — Other Ambulatory Visit: Payer: Self-pay | Admitting: Acute Care

## 2017-03-11 DIAGNOSIS — Z87891 Personal history of nicotine dependence: Secondary | ICD-10-CM

## 2017-03-11 DIAGNOSIS — Z122 Encounter for screening for malignant neoplasm of respiratory organs: Secondary | ICD-10-CM

## 2017-04-22 ENCOUNTER — Telehealth (HOSPITAL_COMMUNITY): Payer: Self-pay | Admitting: *Deleted

## 2017-04-22 ENCOUNTER — Encounter: Payer: Self-pay | Admitting: Internal Medicine

## 2017-04-22 ENCOUNTER — Ambulatory Visit (INDEPENDENT_AMBULATORY_CARE_PROVIDER_SITE_OTHER): Payer: 59 | Admitting: Internal Medicine

## 2017-04-22 VITALS — BP 130/86 | HR 75 | Resp 16 | Ht 70.0 in | Wt 230.0 lb

## 2017-04-22 DIAGNOSIS — R0609 Other forms of dyspnea: Secondary | ICD-10-CM

## 2017-04-22 DIAGNOSIS — R42 Dizziness and giddiness: Secondary | ICD-10-CM | POA: Diagnosis not present

## 2017-04-22 DIAGNOSIS — R0789 Other chest pain: Secondary | ICD-10-CM

## 2017-04-22 DIAGNOSIS — I2510 Atherosclerotic heart disease of native coronary artery without angina pectoris: Secondary | ICD-10-CM | POA: Diagnosis not present

## 2017-04-22 DIAGNOSIS — E782 Mixed hyperlipidemia: Secondary | ICD-10-CM | POA: Diagnosis not present

## 2017-04-22 MED ORDER — METOPROLOL TARTRATE 25 MG PO TABS: ORAL_TABLET | ORAL | 1 refills | Status: DC

## 2017-04-22 NOTE — Progress Notes (Signed)
Follow-up Outpatient Visit Date: 04/22/2017  Primary Care Provider: Lujean Amel, MD [address] [place] [address]  Chief Complaint: Follow-up coronary artery disease  HPI:  Steven Henson is a 56 y.o. year-old male with history of nonobstructive coronary artery disease, PACs, and hyperlipidemia, who presents for follow-up of coronary artery disease.  He was previously followed in our office by Dr. Aundra Dubin, having last been seen in 09/2014.  Today, Steven Henson is concerned about orthostatic lightheadedness that he has experienced for the last couple months.  He notes that when he has been sitting for more than 30 minutes and then tries to stand up quickly, that he feels as though he may pass out.  The feeling passes after about 90 seconds.  He has not passed out.  Steven Henson also notes some exertional dyspnea when walking his dog up an incline.  He is able to exercise and play pickle ball without any difficulty.  He notes one episode of chest pain that was left-sided and lasted about an hour a week ago.  It occurred at rest without precipitating factors.  He has not had any exertional chest pain.  Steven Henson denies palpitations.  He continues to take metoprolol as prescribed.  He noted some dependent leg edema after a recent trip, though this has resolved.  --------------------------------------------------------------------------------------------------  Cardiovascular History & Procedures: Cardiovascular Problems:  Nonobstructive coronary artery disease  Risk Factors:  Known coronary artery disease, hyperlipidemia, male gender, and age greater than 37  Cath/PCI:  LHC (2000): 30% ostial LAD stenosis.  LHC (1999): 50% ostial LAD stenosis.  CV Surgery:  None  EP Procedures and Devices:  None  Non-Invasive Evaluation(s):  Exercise MPI (05/02/12): Normal study without ischemia or scar.  LVEF 53%.  TTE (12/04/97): Normal LVEF (64%).  No valvular  abnormalities.  Recent CV Pertinent Labs: Lab Results  Component Value Date   CHOL 162 08/14/2014   HDL 52.00 08/14/2014   LDLCALC 75 08/14/2014   TRIG 174.0 (H) 08/14/2014   CHOLHDL 3 08/14/2014   INR 1.0 01/17/2007   K 4.3 08/14/2014   MG 2.0 07/17/2013   BUN 22 08/14/2014   CREATININE 0.92 08/14/2014    Past medical and surgical history were reviewed and updated in EPIC.  Current Meds  Medication Sig  . aspirin EC 81 MG tablet Take 81 mg by mouth daily.  Marland Kitchen atorvastatin (LIPITOR) 40 MG tablet Take 1 tablet by mouth at  bedtime  . celecoxib (CELEBREX) 200 MG capsule Take 200 mg by mouth 2 (two) times daily.  . fish oil-omega-3 fatty acids 1000 MG capsule Take 2,000 mg by mouth 2 (two) times daily.   . metoprolol tartrate (LOPRESSOR) 25 MG tablet Take 1 tablet (25 mg total) by mouth 2 (two) times daily. MUST HAVE APPT FOR FURTHER REFILLS.    Allergies: Ciprofloxacin; Lactated ringers; and Tincture of benzoin [benzoin compound]  Social History   Social History  . Marital status: Married    Spouse name: N/A  . Number of children: 2  . Years of education: N/A   Occupational History  . 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 Smoking status: Light Tobacco Smoker    Years: 38.00    Types: Cigars    Last attempt to quit: 2009  . Smokeless tobacco: Never Used     Comment: maybe 2 cigars weekly  . Alcohol use 1.2 oz/week    2 Shots of liquor per week  .  Drug use: No  . Sexual activity: Yes   Other Topics Concern  . Not on file   Social History Narrative  . No narrative on file    Family History  Problem Relation Age of Onset  . Anesthesia problems Mother        effects lasted for days  . Heart disease Mother   . Heart disease Father   . Heart disease Sister   . Lung cancer Maternal Grandfather   . Heart disease Paternal Grandmother   . Heart disease Paternal Grandfather   . Heart failure Paternal Grandfather   . COPD Paternal Grandfather    . Parkinson's disease Maternal Grandmother     Review of Systems: Review of Systems  Constitutional: Negative.   HENT: Negative.   Eyes: Negative.   Respiratory: Positive for shortness of breath (with exertion).   Cardiovascular: Positive for chest pain and leg swelling.  Gastrointestinal: Negative.   Genitourinary: Positive for hematuria (with kidney stones).  Musculoskeletal: Negative.   Skin: Negative.   Neurological: Positive for dizziness.  Endo/Heme/Allergies: Negative.   Psychiatric/Behavioral: Negative.    --------------------------------------------------------------------------------------------------  Physical Exam: BP 130/86   Pulse 75   Resp 16   Ht 5\' 10"  (1.778 m)   Wt 230 lb (104.3 kg)   SpO2 96%   BMI 33.00 kg/m   Position Blood pressure (mmHg) Heart rate (bpm)  Lying  130/86  75  Sitting  132/88  72  Standing  122/90  77  Standing (3 minutes)  130/80  78   General: Obese man, seated comfortably in the exam room. HEENT: No conjunctival pallor or scleral icterus. Moist mucous membranes.  OP clear. Neck: Supple without lymphadenopathy, thyromegaly, JVD, or HJR.  Lungs: Normal work of breathing. Clear to auscultation bilaterally without wheezes or crackles. Heart: Regular rate and rhythm without murmurs, rubs, or gallops. Non-displaced PMI. Abd: Bowel sounds present. Soft, NT/ND without hepatosplenomegaly Ext: No lower extremity edema. Radial, PT, and DP pulses are 2+ bilaterally. Skin: Warm and dry without rash.  EKG:  NSR with poor R-wave progression, slightly more pronounced than on prior tracing in 09/2014.  Lab Results  Component Value Date   WBC 6.7 09/05/2013   HGB 15.3 09/05/2013   HCT 45.2 09/05/2013   MCV 87.8 09/05/2013   PLT 171 09/05/2013    Lab Results  Component Value Date   NA 138 08/14/2014   K 4.3 08/14/2014   CL 104 08/14/2014   CO2 29 08/14/2014   BUN 22 08/14/2014   CREATININE 0.92 08/14/2014   GLUCOSE 102 (H)  08/14/2014   ALT 27 09/26/2013    Lab Results  Component Value Date   CHOL 162 08/14/2014   HDL 52.00 08/14/2014   LDLCALC 75 08/14/2014   TRIG 174.0 (H) 08/14/2014   CHOLHDL 3 08/14/2014   --------------------------------------------------------------------------------------------------  ASSESSMENT AND PLAN: Orthostatic lightheadedness This has been a recurrent issue over the last few months.  Blood pressure today is normal without orthostatic hypotension.  I encouraged Steven Henson to stay well-hydrated.  We will decrease metoprolol to 12.5 mg twice daily.  Coronary artery disease with dyspnea on exertion and atypical chest pain Steven Henson has a history of mild to moderate CAD involving the ostial LAD.  The most recent functional assessment in 2013 was normal.  We have agreed to obtain an exercise myocardial perfusion stress test for further evaluation.  Steven Henson should continue with aspirin and atorvastatin.  If there is evidence of ischemia on the stress  test, we will need to consider escalation of statin therapy and repeating a cardiac catheterization.  Otherwise, we will consider an echo if his dyspnea persists.  Hyperlipidemia Most recent LDL in July was 86.  He should continue his current dose of atorvastatin, though we may need to consider escalating this in the future.  Follow-up: Return to clinic in 6 months.  Nelva Bush, MD 04/22/2017 9:01 AM

## 2017-04-22 NOTE — Telephone Encounter (Signed)
Patient given detailed instructions per Myocardial Perfusion Study Information Sheet for the test on 04/25/17 at 7:30. Patient notified to arrive 15 minutes early and that it is imperative to arrive on time for appointment to keep from having the test rescheduled.  If you need to cancel or reschedule your appointment, please call the office within 24 hours of your appointment. . Patient verbalized understanding.Steven Henson

## 2017-04-22 NOTE — Patient Instructions (Signed)
Medication Instructions:  Decrease metoprolol tartrate (lopressor) to 12.5 mg two times a day. This will be 1/2 of a 25 mg tablet two times a day.  Labwork: None   Testing/Procedures: Your physician has requested that you have en exercise stress myoview. For further information please visit [email or web address]. Please follow instruction sheet, as given.    Follow-Up: Your physician wants you to follow-up in: 6 months with Dr End. (April 2019). You will receive a reminder letter in the mail two months in advance. If you don't receive a letter, please call our office to schedule the follow-up appointment.        If you need a refill on your cardiac medications before your next appointment, please call your pharmacy.

## 2017-04-25 ENCOUNTER — Ambulatory Visit (HOSPITAL_COMMUNITY): Payer: 59 | Attending: Cardiology

## 2017-04-25 DIAGNOSIS — R0609 Other forms of dyspnea: Secondary | ICD-10-CM | POA: Insufficient documentation

## 2017-04-25 DIAGNOSIS — R0789 Other chest pain: Secondary | ICD-10-CM | POA: Diagnosis not present

## 2017-04-25 LAB — MYOCARDIAL PERFUSION IMAGING
CHL CUP MPHR: 162 {beats}/min
CHL CUP RESTING HR STRESS: 64 {beats}/min
CSEPED: 4 min
Estimated workload: 5.8 METS
LHR: 0.3
LV sys vol: 41 mL
LVDIAVOL: 90 mL (ref 62–150)
NUC STRESS TID: 0.8
Peak HR: 164 {beats}/min
Percent HR: 101 %
RPE: 19
SDS: 3
SRS: 7
SSS: 10

## 2017-04-25 MED ORDER — TECHNETIUM TC 99M TETROFOSMIN IV KIT
32.9000 | PACK | Freq: Once | INTRAVENOUS | Status: AC | PRN
  Administered 2017-04-25: 32.9 via INTRAVENOUS
  Filled 2017-04-25: qty 33

## 2017-04-25 MED ORDER — TECHNETIUM TC 99M TETROFOSMIN IV KIT
10.7000 | PACK | Freq: Once | INTRAVENOUS | Status: AC | PRN
  Administered 2017-04-25: 10.7 via INTRAVENOUS
  Filled 2017-04-25: qty 11

## 2017-04-26 ENCOUNTER — Telehealth: Payer: Self-pay | Admitting: *Deleted

## 2017-04-26 DIAGNOSIS — I2510 Atherosclerotic heart disease of native coronary artery without angina pectoris: Secondary | ICD-10-CM

## 2017-04-26 DIAGNOSIS — R9439 Abnormal result of other cardiovascular function study: Secondary | ICD-10-CM

## 2017-04-26 NOTE — Telephone Encounter (Signed)
Notes recorded by Nelva Bush, MD on 04/26/2017 at 8:09 AM EDT Please let Steven Henson know that his stress test does not show any signs of a blockage. His left ventricular contraction was slightly reduced on the stress test; I recommend that we obtain an echo at his convenience to further assess his LVEF and exclude other structural abnormalities that may be contributing to his shortness of breath and orthostatic lightheadedness.

## 2017-05-07 DIAGNOSIS — Z23 Encounter for immunization: Secondary | ICD-10-CM | POA: Diagnosis not present

## 2017-05-11 ENCOUNTER — Other Ambulatory Visit: Payer: Self-pay

## 2017-05-11 ENCOUNTER — Ambulatory Visit (HOSPITAL_COMMUNITY): Payer: 59 | Attending: Cardiology

## 2017-05-11 DIAGNOSIS — Z720 Tobacco use: Secondary | ICD-10-CM | POA: Diagnosis not present

## 2017-05-11 DIAGNOSIS — R002 Palpitations: Secondary | ICD-10-CM | POA: Diagnosis not present

## 2017-05-11 DIAGNOSIS — R9439 Abnormal result of other cardiovascular function study: Secondary | ICD-10-CM

## 2017-05-11 DIAGNOSIS — E785 Hyperlipidemia, unspecified: Secondary | ICD-10-CM | POA: Insufficient documentation

## 2017-05-11 DIAGNOSIS — R0600 Dyspnea, unspecified: Secondary | ICD-10-CM | POA: Insufficient documentation

## 2017-05-11 DIAGNOSIS — I071 Rheumatic tricuspid insufficiency: Secondary | ICD-10-CM | POA: Insufficient documentation

## 2017-05-11 DIAGNOSIS — I2510 Atherosclerotic heart disease of native coronary artery without angina pectoris: Secondary | ICD-10-CM

## 2017-09-24 DIAGNOSIS — J208 Acute bronchitis due to other specified organisms: Secondary | ICD-10-CM | POA: Diagnosis not present

## 2017-10-20 ENCOUNTER — Encounter: Payer: Self-pay | Admitting: Internal Medicine

## 2017-10-20 ENCOUNTER — Ambulatory Visit: Payer: 59 | Admitting: Internal Medicine

## 2017-10-20 VITALS — BP 128/86 | HR 75 | Ht 70.0 in | Wt 224.0 lb

## 2017-10-20 DIAGNOSIS — I2510 Atherosclerotic heart disease of native coronary artery without angina pectoris: Secondary | ICD-10-CM

## 2017-10-20 DIAGNOSIS — R42 Dizziness and giddiness: Secondary | ICD-10-CM | POA: Diagnosis not present

## 2017-10-20 DIAGNOSIS — E782 Mixed hyperlipidemia: Secondary | ICD-10-CM

## 2017-10-20 NOTE — Patient Instructions (Signed)
Medication Instructions:  Stop Metoprolol, unless having symptoms  -- If you need a refill on your cardiac medications before your next appointment, please call your pharmacy. --  Labwork: None ordered  Testing/Procedures: None ordered  Follow-Up: Your physician wants you to follow-up in: 1 year with Dr. Saunders Revel.    You will receive a reminder letter in the mail two months in advance. If you don't receive a letter, please call our office to schedule the follow-up appointment.  Thank you for choosing [facility]!!    Any Other Special Instructions Will Be Listed Below (If Applicable).

## 2017-10-20 NOTE — Progress Notes (Signed)
Follow-up Outpatient Visit Date: 10/20/2017  Primary Care Provider: Lujean Amel, [place] [address]  Chief Complaint: Lightheadedness  HPI:  Steven Henson is a 57 y.o. year-old male with history of nonobstructive coronary artery disease, PACs, and hyperlipidemia, who presents for follow-up of coronary artery disease.  I last saw him in October, at which time he was concerned about orthostatic lightheadedness over the preceding few months.  He also noted exertional dyspnea when walking his dog up an incline.  We agreed to decrease metoprolol tartrate to 12.5 mg twice daily as well as perform an exercise myocardial perfusion stress test.  This showed no evidence of ischemia with mildly reduced LVEF.  Subsequent echo should confirm normal LV systolic function.  Today, Steven Henson reports feeling well.  His orthostatic lightheadedness improved significantly with dose reduction of metoprolol.  He still has brief episodes of lightheadedness when he stands or turns his head a certain way.  This is often accompanied by a pounding in his ears.  He denies chest pain, shortness of breath, palpitations, and orthopnea.  He sometimes notices slight ankle edema when wearing tight socks.  He exercises regularly, playing pickle ball without any symptoms.  --------------------------------------------------------------------------------------------------  Cardiovascular History & Procedures: Cardiovascular Problems:  Nonobstructive coronary artery disease  Risk Factors:  Known coronary artery disease, hyperlipidemia, male gender, and age greater than 61  Cath/PCI:  LHC (2000): 30% ostial LAD stenosis.  LHC (1999): 50% ostial LAD stenosis.  CV Surgery:  None  EP Procedures and Devices:  None  Non-Invasive Evaluation(s):  TTE (05/11/17): Normal LV size with LVEF 55-60%. Mild LAE. Normal RV size and function.  No significant valvular  abnormalities.  Exercise MPI (04/25/17): Low risk study without ischemia or scar.  LVEF 54%.  Impaired exercise capacity.  Exercise MPI (05/02/12): Normal study without ischemia or scar.  LVEF 53%.  TTE (12/04/97): Normal LVEF (64%).  No valvular abnormalities.   Recent CV Pertinent Labs: Lab Results  Component Value Date   CHOL 162 08/14/2014   HDL 52.00 08/14/2014   LDLCALC 75 08/14/2014   TRIG 174.0 (H) 08/14/2014   CHOLHDL 3 08/14/2014   INR 1.0 01/17/2007   K 4.3 08/14/2014   MG 2.0 07/17/2013   BUN 22 08/14/2014   CREATININE 0.92 08/14/2014    Past medical and surgical history were reviewed and updated in EPIC.  Current Meds  Medication Sig  . aspirin EC 81 MG tablet Take 81 mg by mouth daily.  Marland Kitchen atorvastatin (LIPITOR) 40 MG tablet Take 1 tablet by mouth at  bedtime  . celecoxib (CELEBREX) 200 MG capsule Take 200 mg by mouth 2 (two) times daily.  . fish oil-omega-3 fatty acids 1000 MG capsule Take 2,000 mg by mouth 2 (two) times daily.   . metoprolol tartrate (LOPRESSOR) 25 MG tablet Take 1/2 tablet (12.5mg ) by mouth two times a day    Allergies: Ciprofloxacin; Benzoin; Lactated ringers; and Tincture of benzoin [benzoin compound]  Social History   Socioeconomic History  . Marital status: Married    Spouse name: Not on file  . Number of children: 2  . Years of education: Not on file  . Highest education level: Not on file  Occupational History  . Occupation: Customer service manager: [employer]  . Financial resource strain: Not on file  . Food insecurity:    Worry: Not on file    Inability: Not on file  . Transportation needs:  Medical: Not on file    Non-medical: Not on file  Tobacco Use  . Smoking status: Light Tobacco Smoker    Years: 38.00    Types: Cigars    Last attempt to quit: 2009    Years since quitting: 10.2  . Smokeless tobacco: Never Used  . Tobacco comment: maybe 2 cigars weekly  Substance and Sexual Activity  .  Alcohol use: Yes    Alcohol/week: 1.2 oz    Types: 2 Shots of liquor per week  . Drug use: No  . Sexual activity: Yes  Lifestyle  . Physical activity:    Days per week: Not on file    Minutes per session: Not on file  . Stress: Not on file  Relationships  . Social connections:    Talks on phone: Not on file    Gets together: Not on file    Attends religious service: Not on file    Active member of club or organization: Not on file    Attends meetings of clubs or organizations: Not on file    Relationship status: Not on file  . Intimate partner violence:    Fear of current or ex partner: Not on file    Emotionally abused: Not on file    Physically abused: Not on file    Forced sexual activity: Not on file  Other Topics Concern  . Not on file  Social History Narrative  . Not on file    Family History  Problem Relation Age of Onset  . Anesthesia problems Mother        effects lasted for days  . Heart disease Mother   . Heart disease Father   . Heart disease Sister   . Lung cancer Maternal Grandfather   . Heart disease Paternal Grandmother   . Heart disease Paternal Grandfather   . Heart failure Paternal Grandfather   . COPD Paternal Grandfather   . Parkinson's disease Maternal Grandmother     Review of Systems: A 12-system review of systems was performed and was negative except as noted in the HPI.  --------------------------------------------------------------------------------------------------  Physical Exam: BP 128/86   Pulse 75   Ht 5\' 10"  (1.778 m)   Wt 224 lb (101.6 kg)   BMI 32.14 kg/m   General:  NAD. HEENT: No conjunctival pallor or scleral icterus. Moist mucous membranes.  OP clear. Neck: Supple without lymphadenopathy, thyromegaly, JVD, or HJR. Lungs: Normal work of breathing. Clear to auscultation bilaterally without wheezes or crackles. Heart: Regular rate and rhythm without murmurs, rubs, or gallops. Non-displaced PMI. Abd: Bowel sounds  present. Soft, NT/ND without hepatosplenomegaly Ext: Trace ankle edema bilaterally. Radial, PT, and DP pulses are 2+ bilaterally. Skin: Warm and dry without rash.  EKG:  NSR without abnormalities.  Lab Results  Component Value Date   WBC 6.7 09/05/2013   HGB 15.3 09/05/2013   HCT 45.2 09/05/2013   MCV 87.8 09/05/2013   PLT 171 09/05/2013    Lab Results  Component Value Date   NA 138 08/14/2014   K 4.3 08/14/2014   CL 104 08/14/2014   CO2 29 08/14/2014   BUN 22 08/14/2014   CREATININE 0.92 08/14/2014   GLUCOSE 102 (H) 08/14/2014   ALT 27 09/26/2013    Lab Results  Component Value Date   CHOL 162 08/14/2014   HDL 52.00 08/14/2014   LDLCALC 75 08/14/2014   TRIG 174.0 (H) 08/14/2014   CHOLHDL 3 08/14/2014    --------------------------------------------------------------------------------------------------  ASSESSMENT AND PLAN: Coronary  artery disease without angina No symptoms to suggest worsening coronary insufficiency.  Myoview last fall was low-risk.  Continue indefinite aspirin and statin therapy.  Lightheadedness Symptoms sound orthostatic and improved with cutting metoprolol in half after our last visit.  We have agreed to stop metoprolol altogether.  If he experiences return in palpitations, he should restart his current dose of metoprolol.  If dizziness persists, we may need to consider cerebrovascular imaging +/- neurology referral.  Hyperlipidemia Most recent LDL 86 in 01/2017.  Continue atorvastatin 40 mg daily.  Follow-up: Return to clinic in 1 year.  Nelva Bush, MD 10/20/2017 2:30 PM

## 2017-11-10 DIAGNOSIS — N400 Enlarged prostate without lower urinary tract symptoms: Secondary | ICD-10-CM | POA: Diagnosis not present

## 2017-11-10 DIAGNOSIS — N200 Calculus of kidney: Secondary | ICD-10-CM | POA: Diagnosis not present

## 2017-11-17 DIAGNOSIS — M4800 Spinal stenosis, site unspecified: Secondary | ICD-10-CM | POA: Diagnosis not present

## 2017-11-17 DIAGNOSIS — M545 Low back pain: Secondary | ICD-10-CM | POA: Diagnosis not present

## 2017-11-17 DIAGNOSIS — G8929 Other chronic pain: Secondary | ICD-10-CM | POA: Diagnosis not present

## 2017-11-23 DIAGNOSIS — N200 Calculus of kidney: Secondary | ICD-10-CM | POA: Diagnosis not present

## 2017-12-21 DIAGNOSIS — M545 Low back pain: Secondary | ICD-10-CM | POA: Diagnosis not present

## 2017-12-21 DIAGNOSIS — M48061 Spinal stenosis, lumbar region without neurogenic claudication: Secondary | ICD-10-CM | POA: Diagnosis not present

## 2018-02-03 DIAGNOSIS — M5136 Other intervertebral disc degeneration, lumbar region: Secondary | ICD-10-CM | POA: Diagnosis not present

## 2018-02-03 DIAGNOSIS — M47816 Spondylosis without myelopathy or radiculopathy, lumbar region: Secondary | ICD-10-CM | POA: Diagnosis not present

## 2018-02-03 DIAGNOSIS — M48061 Spinal stenosis, lumbar region without neurogenic claudication: Secondary | ICD-10-CM | POA: Diagnosis not present

## 2018-02-28 DIAGNOSIS — L821 Other seborrheic keratosis: Secondary | ICD-10-CM | POA: Diagnosis not present

## 2018-02-28 DIAGNOSIS — D225 Melanocytic nevi of trunk: Secondary | ICD-10-CM | POA: Diagnosis not present

## 2018-02-28 DIAGNOSIS — D1801 Hemangioma of skin and subcutaneous tissue: Secondary | ICD-10-CM | POA: Diagnosis not present

## 2018-03-14 DIAGNOSIS — Z0000 Encounter for general adult medical examination without abnormal findings: Secondary | ICD-10-CM | POA: Diagnosis not present

## 2018-03-14 DIAGNOSIS — Z23 Encounter for immunization: Secondary | ICD-10-CM | POA: Diagnosis not present

## 2018-03-14 DIAGNOSIS — E7800 Pure hypercholesterolemia, unspecified: Secondary | ICD-10-CM | POA: Diagnosis not present

## 2018-03-17 ENCOUNTER — Ambulatory Visit (INDEPENDENT_AMBULATORY_CARE_PROVIDER_SITE_OTHER)
Admission: RE | Admit: 2018-03-17 | Discharge: 2018-03-17 | Disposition: A | Discharge status: Home / Self Care | Payer: 59 | Source: Ambulatory Visit | Attending: Acute Care | Admitting: Acute Care

## 2018-03-17 DIAGNOSIS — Z122 Encounter for screening for malignant neoplasm of respiratory organs: Secondary | ICD-10-CM

## 2018-03-17 DIAGNOSIS — Z87891 Personal history of nicotine dependence: Secondary | ICD-10-CM

## 2018-03-20 ENCOUNTER — Other Ambulatory Visit: Payer: Self-pay | Admitting: Acute Care

## 2018-03-20 DIAGNOSIS — Z87891 Personal history of nicotine dependence: Secondary | ICD-10-CM

## 2018-03-20 DIAGNOSIS — Z122 Encounter for screening for malignant neoplasm of respiratory organs: Secondary | ICD-10-CM

## 2018-04-11 DIAGNOSIS — M48061 Spinal stenosis, lumbar region without neurogenic claudication: Secondary | ICD-10-CM | POA: Diagnosis not present

## 2018-04-11 DIAGNOSIS — M5416 Radiculopathy, lumbar region: Secondary | ICD-10-CM | POA: Diagnosis not present

## 2018-04-27 DIAGNOSIS — M5126 Other intervertebral disc displacement, lumbar region: Secondary | ICD-10-CM | POA: Diagnosis not present

## 2018-04-27 DIAGNOSIS — M5136 Other intervertebral disc degeneration, lumbar region: Secondary | ICD-10-CM | POA: Diagnosis not present

## 2018-04-27 DIAGNOSIS — M47816 Spondylosis without myelopathy or radiculopathy, lumbar region: Secondary | ICD-10-CM | POA: Diagnosis not present

## 2018-05-08 DIAGNOSIS — J0100 Acute maxillary sinusitis, unspecified: Secondary | ICD-10-CM | POA: Diagnosis not present

## 2018-08-01 ENCOUNTER — Other Ambulatory Visit: Payer: Self-pay | Admitting: Neurosurgery

## 2018-08-01 ENCOUNTER — Telehealth: Payer: Self-pay | Admitting: *Deleted

## 2018-08-01 DIAGNOSIS — R030 Elevated blood-pressure reading, without diagnosis of hypertension: Secondary | ICD-10-CM | POA: Diagnosis not present

## 2018-08-01 DIAGNOSIS — M4807 Spinal stenosis, lumbosacral region: Secondary | ICD-10-CM | POA: Diagnosis not present

## 2018-08-01 DIAGNOSIS — M4316 Spondylolisthesis, lumbar region: Secondary | ICD-10-CM | POA: Diagnosis not present

## 2018-08-01 NOTE — Telephone Encounter (Signed)
   [facility] Pre-operative Risk Assessment    Request for surgical clearance:  1. What type of surgery is being performed? L4-5 LUMBAR FUSION   2. When is this surgery scheduled? 08/28/18   3. What type of clearance is required (medical clearance vs. Pharmacy clearance to hold med vs. Both)? MEDICAL  4. Are there any medications that need to be held prior to surgery and how long?ASA   5. Practice name and name of physician performing surgery?  [facility]; DR. GARY CRAM   6. What is your office phone number [phone]    7.   What is your office fax number [phone]  8.   Anesthesia type (None, local, MAC, general) ? GENERAL    Steven Henson 08/01/2018, 3:35 PM  _________________________________________________________________   (provider comments below)

## 2018-08-07 NOTE — Telephone Encounter (Signed)
Has has an appt scheduled for 08/23/2018 with Richardson Dopp, PA-C.

## 2018-08-07 NOTE — Telephone Encounter (Signed)
   Primary Cardiologist:Christopher End, MD  Chart reviewed as part of pre-operative protocol coverage. Because of Steven Henson's past medical history and time since last visit, he/she will require a follow-up visit in order to better assess preoperative cardiovascular risk.  Pre-op covering staff: - Please schedule appointment and call patient to inform them. - Please contact requesting surgeon's office via preferred method (i.e, phone, fax) to inform them of need for appointment prior to surgery.  If applicable, this message will also be routed to pharmacy pool and/or primary cardiologist for input on holding anticoagulant/antiplatelet agent as requested below so that this information is available at time of patient's appointment.   [name], PA  08/07/2018, 3:19 PM

## 2018-08-16 ENCOUNTER — Ambulatory Visit: Payer: 59 | Admitting: Physician Assistant

## 2018-08-18 NOTE — Pre-Procedure Instructions (Signed)
[place]  08/18/2018      [place], [facility] [address] [place] [address] Phone: [phone] Fax: [phone]    Your procedure is scheduled on Mon., Feb. 24, 2020 from 7:30AM-10:58AM  Report to [facility] Entrance "A" at 5:30AM  Call this number if you have problems the morning of surgery:  [phone]   Remember:  Do not eat or drink after midnight on Feb. 23rd    Take these medicines the morning of surgery with A SIP OF WATER: AmLODipine (NORVASC)  Follow your surgeon's instructions on when to stop Aspirin.  If no instructions were given by your surgeon then you will need to call the office to get those instructions.    As of today, stop taking all Other Aspirin Products, Vitamins, Fish oils, and Herbal medications. Also stop all NSAIDS i.e. Advil, Ibuprofen, Motrin, Aleve, Anaprox, Naproxen, BC, Goody Powders, and all Supplements. Including: Celecoxib (CELEBREX)     Do not wear jewelry.  Do not wear lotions, powders, colognes, or deodorant.  Do not shave 48 hours prior to surgery.  Men may shave face.  Do not bring valuables to the hospital.  [facility] is not responsible for any belongings or valuables.  Contacts, dentures or bridgework may not be worn into surgery.  Leave your suitcase in the car.  After surgery it may be brought to your room.  For patients admitted to the hospital, discharge time will be determined by your treatment team.  Patients discharged the day of surgery will not be allowed to drive home.   Special instructions:  [facility]- Preparing For Surgery  Before surgery, you can play an important role. Because skin is not sterile, your skin needs to be as free of germs as possible. You can reduce the number of germs on your skin by washing with CHG (chlorahexidine gluconate) Soap before surgery.  CHG is an antiseptic cleaner which kills germs and bonds with the  skin to continue killing germs even after washing.    Oral Hygiene is also important to reduce your risk of infection.  Remember - BRUSH YOUR TEETH THE MORNING OF SURGERY WITH YOUR REGULAR TOOTHPASTE  Please do not use if you have an allergy to CHG or antibacterial soaps. If your skin becomes reddened/irritated stop using the CHG.  Do not shave (including legs and underarms) for at least 48 hours prior to first CHG shower. It is OK to shave your face.  Please follow these instructions carefully.   1. Shower the NIGHT BEFORE SURGERY and the MORNING OF SURGERY with CHG.   2. If you chose to wash your hair, wash your hair first as usual with your normal shampoo.  3. After you shampoo, rinse your hair and body thoroughly to remove the shampoo.  4. Use CHG as you would any other liquid soap. You can apply CHG directly to the skin and wash gently with a scrungie or a clean washcloth.   5. Apply the CHG Soap to your body ONLY FROM THE NECK DOWN.  Do not use on open wounds or open sores. Avoid contact with your eyes, ears, mouth and genitals (private parts). Wash Face and genitals (private parts)  with your normal soap.  6. Wash thoroughly, paying special attention to the area where your surgery will be performed.  7. Thoroughly rinse your body with warm water from the neck down.  8. DO NOT  shower/wash with your normal soap after using and rinsing off the CHG Soap.  9. Pat yourself dry with a CLEAN TOWEL.  10. Wear CLEAN PAJAMAS to bed the night before surgery, wear comfortable clothes the morning of surgery  11. Place CLEAN SHEETS on your bed the night of your first shower and DO NOT SLEEP WITH PETS.  Day of Surgery:  Do not apply any deodorants/lotions.  Please wear clean clothes to the hospital/surgery center.   Remember to brush your teeth WITH YOUR REGULAR TOOTHPASTE.  Please read over the following fact sheets that you were given. Pain Booklet, Coughing and Deep Breathing, MRSA  Information and Surgical Site Infection Prevention

## 2018-08-21 ENCOUNTER — Encounter (HOSPITAL_COMMUNITY)
Admission: RE | Admit: 2018-08-21 | Discharge: 2018-08-21 | Disposition: A | Discharge status: Home / Self Care | Payer: 59 | Source: Ambulatory Visit | Attending: Neurosurgery | Admitting: Neurosurgery

## 2018-08-21 ENCOUNTER — Other Ambulatory Visit: Payer: Self-pay

## 2018-08-21 ENCOUNTER — Encounter (HOSPITAL_COMMUNITY): Payer: Self-pay

## 2018-08-21 DIAGNOSIS — Z01812 Encounter for preprocedural laboratory examination: Secondary | ICD-10-CM | POA: Insufficient documentation

## 2018-08-21 HISTORY — DX: Family history of other specified conditions: Z84.89

## 2018-08-21 HISTORY — DX: Personal history of urinary calculi: Z87.442

## 2018-08-21 LAB — CBC
HCT: 49.3 % (ref 39.0–52.0)
Hemoglobin: 15.6 g/dL (ref 13.0–17.0)
MCH: 28.6 pg (ref 26.0–34.0)
MCHC: 31.6 g/dL (ref 30.0–36.0)
MCV: 90.3 fL (ref 80.0–100.0)
Platelets: 207 10*3/uL (ref 150–400)
RBC: 5.46 MIL/uL (ref 4.22–5.81)
RDW: 13.2 % (ref 11.5–15.5)
WBC: 8.4 10*3/uL (ref 4.0–10.5)
nRBC: 0 % (ref 0.0–0.2)

## 2018-08-21 LAB — ABO/RH: ABO/RH(D): B POS

## 2018-08-21 LAB — BASIC METABOLIC PANEL
Anion gap: 8 (ref 5–15)
BUN: 21 mg/dL — ABNORMAL HIGH (ref 6–20)
CO2: 27 mmol/L (ref 22–32)
CREATININE: 1.01 mg/dL (ref 0.61–1.24)
Calcium: 9.4 mg/dL (ref 8.9–10.3)
Chloride: 103 mmol/L (ref 98–111)
GFR calc non Af Amer: >60 mL/min (ref >60)
Glucose, Bld: 100 mg/dL — ABNORMAL HIGH (ref 70–99)
Potassium: 4.2 mmol/L (ref 3.5–5.1)
Sodium: 138 mmol/L (ref 135–145)

## 2018-08-21 LAB — TYPE AND SCREEN
ABO/RH(D): B POS
ANTIBODY SCREEN: NEGATIVE

## 2018-08-21 LAB — SURGICAL PCR SCREEN
MRSA, PCR: NEGATIVE
Staphylococcus aureus: NEGATIVE

## 2018-08-21 NOTE — Progress Notes (Signed)
PCP - Dr. Harley Hallmark 03/2018 Cardiologist - Has seen Dr. Rex Kras, Dr. Aundra Dubin, Dr. Saunders Revel.  He is going tomorrow 2/18 to see Richardson Dopp, PA for clearance note. H/o pat.  Hasn't had any issues for quite a while now.  Chest x-ray - CT Chest/lung cancer screening  EKG - 10/2017  Stress Test - 04/2017  ECHO - 05/11/2017  Cardiac Cath - more than 10 yrs ago.  Sleep Study - "yrs ago" CPAP - unable to wear.  Fasting Blood Sugar - n/a Checks Blood Sugar _____ times a day  Blood Thinner Instructions: Aspirin Instructions: he has stopped his aspirin, celebrex, vitamins 2/17  (ld 2/16)  Anesthesia review: yes  Patient denies shortness of breath, fever, cough and chest pain at PAT appointment   Patient verbalized understanding of instructions that were given to them at the PAT appointment. Patient was also instructed that they will need to review over the PAT instructions again at home before surgery.

## 2018-08-22 NOTE — Progress Notes (Signed)
Cardiology Office Note:    Date:  08/23/2018   ID:  Steven Henson, DOB 27-Jun-1961, MRN [id number]  PCP:  Lujean Amel, MD  Cardiologist:  Nelva Bush, MD >> will est with Dr. Acie Fredrickson / Richardson Dopp, PA-C   Electrophysiologist:  None   Referring MD: Lujean Amel, MD   Chief Complaint  Patient presents with  . Surgical Clearance    History of Present Illness:    Steven Henson is a 58 y.o. male with non-obstructive CAD, hyperlipidemia, PACs, tobacco use.  He was previously followed by Dr. Aundra Dubin. He was last seen by Dr. Saunders Revel in 10/2017.  Metoprolol was DC'd 2/2 orthostatic intolerance.    Mr. Steven Henson returns for surgical clearance.  He is scheduled for L4-5 fusion with Dr. Saintclair Henson on 08/28/2018.  He is here alone today.  He denies chest pain, shortness of breath, syncope, orthopnea, paroxysmal nocturnal dyspnea, lower extremity swelling  Prior CV studies:   The following studies were reviewed today:  Echo 05/11/17 EF 55-60, no RWMA, mild LAE  Myoview 04/25/17 1. EF 54%, near-normal systolic function.  No regional wall motion abnormalities.  2. No significant defect on perfusion images.  No evidence for ischemia/infarction.  3. Below average exercise tolerance, no ischemic ECG changes on exercise ECG.  Low risk study.   Myoview 05/02/12 Overall Impression:  Normal stress nuclear study. LV Ejection Fraction: 53%.  LV Wall Motion:  NL LV Function; NL Wall Motion  Cardiac Catheterization 12/1998 LAD ostial 30  Past Medical History:  Diagnosis Date  . Arthritis    lower back  . Coronary artery disease   . Family history of adverse reaction to anesthesia    "my mother had a hard waking up from anesthesia"  . H/O echocardiogram 12/04/1997   Normal LV systolic function. No valvular abnormalities.  . H/O myocardial perfusion scan 05/20/2009   The post stress myocardial perfusion images show a normal pattern of perfusion in all regions. the post stress left ventricle is  normal in size. no significant wall motion abnormalities noted. Normal myocardial perfusion study. this is a low risk scan.  Marland Kitchen History of kidney stones    "lots"  . Hyperlipidemia   . Hypertension   . Kidney stones   . Lipoma    on back  . Sleep apnea    no CPAP use - denies apnea, waking up gasping/choking, denies daytime sleepiness   Surgical Hx: The patient  has a past surgical history that includes ORIF finger / thumb fracture (2002); Kidney stone surgery (2006); Cystoscopy/retrograde/ureteroscopy/stone extraction with basket (1/02, 12/01); Shoulder arthroscopy w/ subacromial decompression and distal clavicle excision (7/08); Cardiac catheterization (> 10 yrs.); Lipoma excision (05/14/2011); Back surgery; and Fracture surgery (1974).   Current Medications: Current Meds  Medication Sig  . amLODipine (NORVASC) 5 MG tablet Take 5 mg by mouth daily.  Marland Kitchen aspirin EC 81 MG tablet Take 81 mg by mouth daily.  Marland Kitchen atorvastatin (LIPITOR) 40 MG tablet Take 1 tablet by mouth at  bedtime  . celecoxib (CELEBREX) 200 MG capsule Take 200 mg by mouth 2 (two) times daily.  . fish oil-omega-3 fatty acids 1000 MG capsule Take 2,000 mg by mouth 2 (two) times daily.   . traMADol (ULTRAM) 50 MG tablet Take 50 mg by mouth every 6 (six) hours as needed. for pain     Allergies:   Ciprofloxacin; Benzoin; Lactated ringers; and Tincture of benzoin [benzoin compound]   Social History   Tobacco Use  . Smoking  status: Light Tobacco Smoker    Years: 38.00    Types: Cigars    Last attempt to quit: 2009    Years since quitting: 11.1  . Smokeless tobacco: Never Used  . Tobacco comment: maybe 2 cigars weekly  Substance Use Topics  . Alcohol use: Yes    Alcohol/week: 2.0 standard drinks    Types: 2 Shots of liquor per week  . Drug use: No     Family Hx: The patient's family history includes Anesthesia problems in his mother; COPD in his paternal grandfather; Heart disease in his father, mother, paternal  grandfather, paternal grandmother, and sister; Heart failure in his paternal grandfather; Lung cancer in his maternal grandfather; Parkinson's disease in his maternal grandmother.  ROS:   Please see the history of present illness.    ROS All other systems reviewed and are negative.   EKGs/Labs/Other Test Reviewed:    EKG:  EKG is  ordered today.  The ekg ordered today demonstrates normal sinus rhythm, 85, normal axis, QTC 437, no change from prior tracing  Recent Labs: 08/21/2018: BUN 21; Creatinine, Ser 1.01; Hemoglobin 15.6; Platelets 207; Potassium 4.2; Sodium 138   Recent Lipid Panel Lab Results  Component Value Date/Time   CHOL 162 08/14/2014 08:07 AM   TRIG 174.0 (H) 08/14/2014 08:07 AM   HDL 52.00 08/14/2014 08:07 AM   CHOLHDL 3 08/14/2014 08:07 AM   LDLCALC 75 08/14/2014 08:07 AM    Physical Exam:    VS:  BP 138/88   Pulse 85   Ht 5\' 10"  (1.778 m)   Wt 215 lb 12.8 oz (97.9 kg)   SpO2 99%   BMI 30.96 kg/m     Wt Readings from Last 3 Encounters:  08/23/18 215 lb 12.8 oz (97.9 kg)  10/20/17 224 lb (101.6 kg)  04/25/17 230 lb (104.3 kg)     Physical Exam  Constitutional: He is oriented to person, place, and time. He appears well-developed and well-nourished. No distress.  HENT:  Head: Normocephalic and atraumatic.  Eyes: No scleral icterus.  Neck: Neck supple. No JVD present. No thyromegaly present.  Cardiovascular: Normal rate, regular rhythm, S1 normal and S2 normal.  No murmur heard. Pulmonary/Chest: Breath sounds normal. He has no rales.  Abdominal: Soft. There is no hepatomegaly.  Musculoskeletal:        General: No edema.  Lymphadenopathy:    He has no cervical adenopathy.  Neurological: He is alert and oriented to person, place, and time.  Skin: Skin is warm and dry.  Psychiatric: He has a normal mood and affect.    ASSESSMENT & PLAN:    Preoperative cardiovascular examination The Revised Cardiac Risk Index indicates that his Perioperative Risk  of Major Cardiac Event is low 0.4%.  Therefore, he is at low risk for perioperative complications.  His Functional Capacity in METs is good at 5.07 as indicated by the Duke Activity Status Index (DASI).  According to ACC/AHA guidelines, no further cardiovascular testing needed.  The patient may proceed to surgery at acceptable risk.   Aspirin can be held for 7 days prior to his surgery.  Please resume Aspirin post operatively when it is felt to be safe from a bleeding standpoint.    Coronary artery disease involving native coronary artery of native heart without angina pectoris  Mild coronary plaque by cardiac catheterization in 2000.  Myoview in 2018 was low risk.  He denies anginal symptoms.  Continue aspirin, statin.  Mixed hyperlipidemia  Managed by primary care.  Premature atrial beats  The patient's blood pressure is controlled on his current regimen.  Continue current therapy.    Dispo:  Return in about 18 months (around 02/21/2020) for Routine Follow Up, w/ Dr. Acie Fredrickson, or Richardson Dopp, PA-C.   Medication Adjustments/Labs and Tests Ordered: Current medicines are reviewed at length with the patient today.  Concerns regarding medicines are outlined above.  Tests Ordered: Orders Placed This Encounter  Procedures  . EKG 12-Lead   Medication Changes: No orders of the defined types were placed in this encounter.   Signed, Richardson Dopp, PA-C  08/23/2018 3:39 PM    [facility] [place], [place], [place]  [address] Phone: [phone]; Fax: [phone]

## 2018-08-23 ENCOUNTER — Encounter: Payer: Self-pay | Admitting: Physician Assistant

## 2018-08-23 ENCOUNTER — Ambulatory Visit (INDEPENDENT_AMBULATORY_CARE_PROVIDER_SITE_OTHER): Payer: 59 | Admitting: Physician Assistant

## 2018-08-23 VITALS — BP 138/88 | HR 85 | Ht 70.0 in | Wt 215.8 lb

## 2018-08-23 DIAGNOSIS — I2510 Atherosclerotic heart disease of native coronary artery without angina pectoris: Secondary | ICD-10-CM | POA: Diagnosis not present

## 2018-08-23 DIAGNOSIS — E782 Mixed hyperlipidemia: Secondary | ICD-10-CM

## 2018-08-23 DIAGNOSIS — Z01810 Encounter for preprocedural cardiovascular examination: Secondary | ICD-10-CM | POA: Diagnosis not present

## 2018-08-23 DIAGNOSIS — I491 Atrial premature depolarization: Secondary | ICD-10-CM

## 2018-08-23 NOTE — Patient Instructions (Signed)
Medication Instructions:  No changes  If you need a refill on your cardiac medications before your next appointment, please call your pharmacy.   Lab work: None  If you have labs (blood work) drawn today and your tests are completely normal, you will receive your results only by: Marland Kitchen MyChart Message (if you have MyChart) OR . A paper copy in the mail If you have any lab test that is abnormal or we need to change your treatment, we will call you to review the results.  Testing/Procedures: None   Follow-Up: At [facility], you and your health needs are our priority.  As part of our continuing mission to provide you with exceptional heart care, we have created designated Provider Care Teams.  These Care Teams include your primary Cardiologist (physician) and Advanced Practice Providers (APPs -  Physician Assistants and Nurse Practitioners) who all work together to provide you with the care you need, when you need it. You will need a follow up appointment in:  18 months.  Please call our office 2 months in advance to schedule this appointment.  You may see Mertie Moores, MD or one of the following Advanced Practice Providers on your designated Care Team: Richardson Dopp, PA-C  Any Other Special Instructions Will Be Listed Below (If Applicable).

## 2018-08-27 NOTE — Anesthesia Preprocedure Evaluation (Addendum)
Anesthesia Evaluation  Patient identified by MRN, date of birth, ID band Patient awake    Reviewed: Allergy & Precautions, NPO status , Patient's Chart, lab work & pertinent test results  Airway Mallampati: II  TM Distance: >3 FB Neck ROM: full    Dental  (+) Edentulous Upper, Upper Dentures, Partial Lower, Dental Advidsory Given   Pulmonary sleep apnea , former smoker,    Pulmonary exam normal        Cardiovascular hypertension, Pt. on medications + CAD  Normal cardiovascular exam Rhythm:regular  ECG: NSR, rate 85  ECHO: LV EF: 55% -   60%  Myocardial perfusion Nuclear stress EF: 54%. Blood pressure demonstrated a normal response to exercise. There was no ST segment deviation noted during stress. The left ventricular ejection fraction is mildly decreased (45-54%). This is a low risk study.   Pre-op eval per Cardiology    Neuro/Psych negative neurological ROS  negative psych ROS   GI/Hepatic negative GI ROS, Neg liver ROS,   Endo/Other  negative endocrine ROS  Renal/GU      Musculoskeletal negative musculoskeletal ROS (+)   Abdominal (+) + obese,   Peds  Hematology HLD   Anesthesia Other Findings Spondylolisthesis  Reproductive/Obstetrics                           Anesthesia Physical Anesthesia Plan  ASA: II  Anesthesia Plan: General   Post-op Pain Management:    Induction: Intravenous  PONV Risk Score and Plan: 2 and Ondansetron, Dexamethasone, Midazolam and Treatment may vary due to age or medical condition  Airway Management Planned: Oral ETT  Additional Equipment:   Intra-op Plan:   Post-operative Plan: Extubation in OR  Informed Consent: I have reviewed the patients History and Physical, chart, labs and discussed the procedure including the risks, benefits and alternatives for the proposed anesthesia with the patient or authorized representative who has indicated  his/her understanding and acceptance.     Dental advisory given  Plan Discussed with: CRNA  Anesthesia Plan Comments:        Anesthesia Quick Evaluation

## 2018-08-28 ENCOUNTER — Encounter (HOSPITAL_COMMUNITY)
Admission: RE | Disposition: A | Discharge status: Home / Self Care | Payer: Self-pay | Source: Home / Self Care | Attending: Neurosurgery

## 2018-08-28 ENCOUNTER — Inpatient Hospital Stay (HOSPITAL_COMMUNITY): Payer: 59 | Admitting: Physician Assistant

## 2018-08-28 ENCOUNTER — Other Ambulatory Visit: Payer: Self-pay

## 2018-08-28 ENCOUNTER — Encounter (HOSPITAL_COMMUNITY): Payer: Self-pay | Admitting: General Practice

## 2018-08-28 ENCOUNTER — Inpatient Hospital Stay (HOSPITAL_COMMUNITY): Payer: 59

## 2018-08-28 ENCOUNTER — Inpatient Hospital Stay (HOSPITAL_COMMUNITY)
Admission: RE | Admit: 2018-08-28 | Discharge: 2018-08-29 | DRG: 460 | Disposition: A | Discharge status: Home / Self Care | Payer: 59 | Attending: Neurosurgery | Admitting: Neurosurgery

## 2018-08-28 ENCOUNTER — Inpatient Hospital Stay (HOSPITAL_COMMUNITY): Payer: 59 | Admitting: Anesthesiology

## 2018-08-28 DIAGNOSIS — Z888 Allergy status to other drugs, medicaments and biological substances status: Secondary | ICD-10-CM

## 2018-08-28 DIAGNOSIS — I10 Essential (primary) hypertension: Secondary | ICD-10-CM | POA: Diagnosis present

## 2018-08-28 DIAGNOSIS — M48061 Spinal stenosis, lumbar region without neurogenic claudication: Secondary | ICD-10-CM | POA: Diagnosis present

## 2018-08-28 DIAGNOSIS — M532X6 Spinal instabilities, lumbar region: Secondary | ICD-10-CM | POA: Diagnosis not present

## 2018-08-28 DIAGNOSIS — R402362 Coma scale, best motor response, obeys commands, at arrival to emergency department: Secondary | ICD-10-CM | POA: Diagnosis present

## 2018-08-28 DIAGNOSIS — Z825 Family history of asthma and other chronic lower respiratory diseases: Secondary | ICD-10-CM | POA: Diagnosis not present

## 2018-08-28 DIAGNOSIS — R402252 Coma scale, best verbal response, oriented, at arrival to emergency department: Secondary | ICD-10-CM | POA: Diagnosis present

## 2018-08-28 DIAGNOSIS — Z820 Family history of epilepsy and other diseases of the nervous system: Secondary | ICD-10-CM

## 2018-08-28 DIAGNOSIS — E669 Obesity, unspecified: Secondary | ICD-10-CM | POA: Diagnosis present

## 2018-08-28 DIAGNOSIS — R402142 Coma scale, eyes open, spontaneous, at arrival to emergency department: Secondary | ICD-10-CM | POA: Diagnosis present

## 2018-08-28 DIAGNOSIS — M5416 Radiculopathy, lumbar region: Secondary | ICD-10-CM | POA: Diagnosis not present

## 2018-08-28 DIAGNOSIS — M4326 Fusion of spine, lumbar region: Secondary | ICD-10-CM | POA: Diagnosis not present

## 2018-08-28 DIAGNOSIS — Z801 Family history of malignant neoplasm of trachea, bronchus and lung: Secondary | ICD-10-CM | POA: Diagnosis not present

## 2018-08-28 DIAGNOSIS — I2510 Atherosclerotic heart disease of native coronary artery without angina pectoris: Secondary | ICD-10-CM | POA: Diagnosis not present

## 2018-08-28 DIAGNOSIS — Z7982 Long term (current) use of aspirin: Secondary | ICD-10-CM | POA: Diagnosis not present

## 2018-08-28 DIAGNOSIS — G4730 Sleep apnea, unspecified: Secondary | ICD-10-CM | POA: Diagnosis present

## 2018-08-28 DIAGNOSIS — M79604 Pain in right leg: Secondary | ICD-10-CM | POA: Diagnosis not present

## 2018-08-28 DIAGNOSIS — Z6830 Body mass index (BMI) 30.0-30.9, adult: Secondary | ICD-10-CM

## 2018-08-28 DIAGNOSIS — E785 Hyperlipidemia, unspecified: Secondary | ICD-10-CM | POA: Diagnosis present

## 2018-08-28 DIAGNOSIS — F17290 Nicotine dependence, other tobacco product, uncomplicated: Secondary | ICD-10-CM | POA: Diagnosis present

## 2018-08-28 DIAGNOSIS — Z881 Allergy status to other antibiotic agents status: Secondary | ICD-10-CM

## 2018-08-28 DIAGNOSIS — Z8249 Family history of ischemic heart disease and other diseases of the circulatory system: Secondary | ICD-10-CM

## 2018-08-28 DIAGNOSIS — Z419 Encounter for procedure for purposes other than remedying health state, unspecified: Secondary | ICD-10-CM

## 2018-08-28 SURGERY — POSTERIOR LUMBAR FUSION 1 LEVEL
Anesthesia: General | Site: Back

## 2018-08-28 MED ORDER — FENTANYL CITRATE (PF) 250 MCG/5ML IJ SOLN
INTRAMUSCULAR | Status: AC
  Filled 2018-08-28: qty 5

## 2018-08-28 MED ORDER — PHENYLEPHRINE HCL 10 MG/ML IJ SOLN
INTRAMUSCULAR | Status: DC | PRN
  Administered 2018-08-28 (×2): 80 ug via INTRAVENOUS
  Administered 2018-08-28: 40 ug via INTRAVENOUS

## 2018-08-28 MED ORDER — THROMBIN 20000 UNITS EX SOLR
CUTANEOUS | Status: AC
  Filled 2018-08-28: qty 20000

## 2018-08-28 MED ORDER — TRAMADOL HCL 50 MG PO TABS: 50.0000 mg | ORAL_TABLET | Freq: Four times a day (QID) | ORAL | Status: DC | PRN

## 2018-08-28 MED ORDER — SODIUM CHLORIDE 0.9 % IV SOLN
INTRAVENOUS | Status: DC | PRN
  Administered 2018-08-28: 500 mL

## 2018-08-28 MED ORDER — SODIUM CHLORIDE 0.9 % IV SOLN: 250.0000 mL | INTRAVENOUS | Status: DC

## 2018-08-28 MED ORDER — THROMBIN 5000 UNITS EX SOLR
CUTANEOUS | Status: AC
  Filled 2018-08-28: qty 5000

## 2018-08-28 MED ORDER — OXYCODONE HCL 5 MG PO TABS: 5.0000 mg | ORAL_TABLET | Freq: Once | ORAL | Status: DC | PRN

## 2018-08-28 MED ORDER — PANTOPRAZOLE SODIUM 40 MG IV SOLR: 40.0000 mg | Freq: Every day | INTRAVENOUS | Status: DC

## 2018-08-28 MED ORDER — MIDAZOLAM HCL 5 MG/5ML IJ SOLN
INTRAMUSCULAR | Status: DC | PRN
  Administered 2018-08-28: 2 mg via INTRAVENOUS

## 2018-08-28 MED ORDER — PROPOFOL 10 MG/ML IV BOLUS
INTRAVENOUS | Status: DC | PRN
  Administered 2018-08-28: 150 mg via INTRAVENOUS

## 2018-08-28 MED ORDER — ALUM & MAG HYDROXIDE-SIMETH 200-200-20 MG/5ML PO SUSP: 30.0000 mL | Freq: Four times a day (QID) | ORAL | Status: DC | PRN

## 2018-08-28 MED ORDER — CELECOXIB 200 MG PO CAPS
200.0000 mg | ORAL_CAPSULE | Freq: Two times a day (BID) | ORAL | Status: DC
  Administered 2018-08-28 (×2): 200 mg via ORAL
  Filled 2018-08-28 (×2): qty 1

## 2018-08-28 MED ORDER — KETOROLAC TROMETHAMINE 30 MG/ML IJ SOLN: 30.0000 mg | Freq: Once | INTRAMUSCULAR | Status: DC | PRN

## 2018-08-28 MED ORDER — OMEGA-3-ACID ETHYL ESTERS 1 G PO CAPS
2.0000 g | ORAL_CAPSULE | Freq: Two times a day (BID) | ORAL | Status: DC
  Administered 2018-08-28: 2 g via ORAL
  Filled 2018-08-28 (×3): qty 2

## 2018-08-28 MED ORDER — BUPIVACAINE HCL (PF) 0.25 % IJ SOLN
INTRAMUSCULAR | Status: AC
  Filled 2018-08-28: qty 30

## 2018-08-28 MED ORDER — SUGAMMADEX SODIUM 200 MG/2ML IV SOLN
INTRAVENOUS | Status: DC | PRN
  Administered 2018-08-28: 200 mg via INTRAVENOUS

## 2018-08-28 MED ORDER — ACETAMINOPHEN 650 MG RE SUPP: 650.0000 mg | RECTAL | Status: DC | PRN

## 2018-08-28 MED ORDER — PROMETHAZINE HCL 25 MG/ML IJ SOLN: 6.2500 mg | INTRAMUSCULAR | Status: DC | PRN

## 2018-08-28 MED ORDER — METOPROLOL TARTRATE 5 MG/5ML IV SOLN
INTRAVENOUS | Status: AC
  Filled 2018-08-28: qty 5

## 2018-08-28 MED ORDER — MIDAZOLAM HCL 2 MG/2ML IJ SOLN
INTRAMUSCULAR | Status: AC
  Filled 2018-08-28: qty 2

## 2018-08-28 MED ORDER — OXYCODONE HCL 5 MG/5ML PO SOLN: 5.0000 mg | Freq: Once | ORAL | Status: DC | PRN

## 2018-08-28 MED ORDER — CYCLOBENZAPRINE HCL 10 MG PO TABS
10.0000 mg | ORAL_TABLET | Freq: Three times a day (TID) | ORAL | Status: DC | PRN
  Administered 2018-08-28 – 2018-08-29 (×2): 10 mg via ORAL
  Filled 2018-08-28 (×2): qty 1

## 2018-08-28 MED ORDER — DEXAMETHASONE SODIUM PHOSPHATE 10 MG/ML IJ SOLN
INTRAMUSCULAR | Status: DC | PRN
  Administered 2018-08-28: 10 mg via INTRAVENOUS

## 2018-08-28 MED ORDER — CEFAZOLIN SODIUM-DEXTROSE 2-4 GM/100ML-% IV SOLN
INTRAVENOUS | Status: AC
  Filled 2018-08-28: qty 100

## 2018-08-28 MED ORDER — ONDANSETRON HCL 4 MG/2ML IJ SOLN
INTRAMUSCULAR | Status: DC | PRN
  Administered 2018-08-28: 4 mg via INTRAVENOUS

## 2018-08-28 MED ORDER — ACETAMINOPHEN 500 MG PO TABS: 1000.0000 mg | ORAL_TABLET | Freq: Four times a day (QID) | ORAL | Status: DC | PRN

## 2018-08-28 MED ORDER — FENTANYL CITRATE (PF) 100 MCG/2ML IJ SOLN
INTRAMUSCULAR | Status: DC | PRN
  Administered 2018-08-28: 150 ug via INTRAVENOUS
  Administered 2018-08-28 (×2): 50 ug via INTRAVENOUS

## 2018-08-28 MED ORDER — ACETAMINOPHEN 325 MG PO TABS
650.0000 mg | ORAL_TABLET | ORAL | Status: DC | PRN
  Administered 2018-08-29: 650 mg via ORAL
  Filled 2018-08-28: qty 2

## 2018-08-28 MED ORDER — ONDANSETRON HCL 4 MG PO TABS: 4.0000 mg | ORAL_TABLET | Freq: Four times a day (QID) | ORAL | Status: DC | PRN

## 2018-08-28 MED ORDER — CEFAZOLIN SODIUM-DEXTROSE 2-4 GM/100ML-% IV SOLN
2.0000 g | INTRAVENOUS | Status: AC
  Administered 2018-08-28: 2 g via INTRAVENOUS

## 2018-08-28 MED ORDER — THROMBIN 20000 UNITS EX SOLR
CUTANEOUS | Status: DC | PRN
  Administered 2018-08-28: 20 mL

## 2018-08-28 MED ORDER — CHLORHEXIDINE GLUCONATE CLOTH 2 % EX PADS: 6.0000 | MEDICATED_PAD | Freq: Once | CUTANEOUS | Status: DC

## 2018-08-28 MED ORDER — HYDROMORPHONE HCL 1 MG/ML IJ SOLN: 0.5000 mg | INTRAMUSCULAR | Status: DC | PRN

## 2018-08-28 MED ORDER — ACETAMINOPHEN 500 MG PO TABS
ORAL_TABLET | ORAL | Status: AC
  Administered 2018-08-28: 1000 mg via ORAL
  Filled 2018-08-28: qty 2

## 2018-08-28 MED ORDER — LACTATED RINGERS IV SOLN: INTRAVENOUS | Status: DC | PRN

## 2018-08-28 MED ORDER — LIDOCAINE-EPINEPHRINE 1 %-1:100000 IJ SOLN
INTRAMUSCULAR | Status: DC | PRN
  Administered 2018-08-28: 6 mL

## 2018-08-28 MED ORDER — SODIUM CHLORIDE 0.9% FLUSH: 3.0000 mL | INTRAVENOUS | Status: DC | PRN

## 2018-08-28 MED ORDER — MENTHOL 3 MG MT LOZG: 1.0000 | LOZENGE | OROMUCOSAL | Status: DC | PRN

## 2018-08-28 MED ORDER — ONDANSETRON HCL 4 MG/2ML IJ SOLN: 4.0000 mg | Freq: Four times a day (QID) | INTRAMUSCULAR | Status: DC | PRN

## 2018-08-28 MED ORDER — DEXAMETHASONE SODIUM PHOSPHATE 10 MG/ML IJ SOLN: 10.0000 mg | INTRAMUSCULAR | Status: DC

## 2018-08-28 MED ORDER — SODIUM CHLORIDE 0.9% FLUSH
3.0000 mL | Freq: Two times a day (BID) | INTRAVENOUS | Status: DC
  Administered 2018-08-28: 3 mL via INTRAVENOUS

## 2018-08-28 MED ORDER — PANTOPRAZOLE SODIUM 40 MG PO TBEC
40.0000 mg | DELAYED_RELEASE_TABLET | Freq: Every day | ORAL | Status: DC
  Administered 2018-08-28: 40 mg via ORAL
  Filled 2018-08-28: qty 1

## 2018-08-28 MED ORDER — ACETAMINOPHEN 500 MG PO TABS
1000.0000 mg | ORAL_TABLET | Freq: Once | ORAL | Status: AC
  Administered 2018-08-28: 1000 mg via ORAL

## 2018-08-28 MED ORDER — ARTIFICIAL TEARS OPHTHALMIC OINT
TOPICAL_OINTMENT | OPHTHALMIC | Status: DC | PRN
  Administered 2018-08-28: 1 via OPHTHALMIC

## 2018-08-28 MED ORDER — SODIUM CHLORIDE 0.9 % IV SOLN
INTRAVENOUS | Status: DC | PRN
  Administered 2018-08-28: 20 ug/min via INTRAVENOUS

## 2018-08-28 MED ORDER — HYDROMORPHONE HCL 1 MG/ML IJ SOLN: 0.2500 mg | INTRAMUSCULAR | Status: DC | PRN

## 2018-08-28 MED ORDER — ATORVASTATIN CALCIUM 40 MG PO TABS
40.0000 mg | ORAL_TABLET | Freq: Every day | ORAL | Status: DC
  Administered 2018-08-28: 40 mg via ORAL
  Filled 2018-08-28: qty 1

## 2018-08-28 MED ORDER — LIDOCAINE-EPINEPHRINE 1 %-1:100000 IJ SOLN
INTRAMUSCULAR | Status: AC
  Filled 2018-08-28: qty 1

## 2018-08-28 MED ORDER — PROPOFOL 10 MG/ML IV BOLUS
INTRAVENOUS | Status: AC
  Filled 2018-08-28: qty 20

## 2018-08-28 MED ORDER — OMEGA-3 FATTY ACIDS 1000 MG PO CAPS: 2000.0000 mg | ORAL_CAPSULE | Freq: Two times a day (BID) | ORAL | Status: DC

## 2018-08-28 MED ORDER — DEXAMETHASONE SODIUM PHOSPHATE 10 MG/ML IJ SOLN
INTRAMUSCULAR | Status: AC
  Filled 2018-08-28: qty 1

## 2018-08-28 MED ORDER — ASPIRIN EC 81 MG PO TBEC
81.0000 mg | DELAYED_RELEASE_TABLET | Freq: Every day | ORAL | Status: DC
  Administered 2018-08-28: 81 mg via ORAL
  Filled 2018-08-28: qty 1

## 2018-08-28 MED ORDER — AMLODIPINE BESYLATE 5 MG PO TABS: 5.0000 mg | ORAL_TABLET | Freq: Every day | ORAL | Status: DC

## 2018-08-28 MED ORDER — OXYCODONE HCL 5 MG PO TABS
10.0000 mg | ORAL_TABLET | ORAL | Status: DC | PRN
  Administered 2018-08-28 – 2018-08-29 (×3): 10 mg via ORAL
  Filled 2018-08-28 (×3): qty 2

## 2018-08-28 MED ORDER — METOPROLOL TARTRATE 5 MG/5ML IV SOLN
5.0000 mg | INTRAVENOUS | Status: DC | PRN
  Administered 2018-08-28 (×2): 5 mg via INTRAVENOUS
  Filled 2018-08-28 (×2): qty 5

## 2018-08-28 MED ORDER — SODIUM CHLORIDE 0.9 % IV SOLN
INTRAVENOUS | Status: DC
  Administered 2018-08-28 (×3): via INTRAVENOUS

## 2018-08-28 MED ORDER — CEFAZOLIN SODIUM-DEXTROSE 2-4 GM/100ML-% IV SOLN
2.0000 g | Freq: Three times a day (TID) | INTRAVENOUS | Status: AC
  Administered 2018-08-28 (×2): 2 g via INTRAVENOUS
  Filled 2018-08-28 (×2): qty 100

## 2018-08-28 MED ORDER — ROCURONIUM BROMIDE 50 MG/5ML IV SOSY
PREFILLED_SYRINGE | INTRAVENOUS | Status: DC | PRN
  Administered 2018-08-28: 50 mg via INTRAVENOUS
  Administered 2018-08-28: 20 mg via INTRAVENOUS
  Administered 2018-08-28: 10 mg via INTRAVENOUS

## 2018-08-28 MED ORDER — BUPIVACAINE LIPOSOME 1.3 % IJ SUSP
20.0000 mL | INTRAMUSCULAR | Status: AC
  Administered 2018-08-28: 20 mL
  Filled 2018-08-28: qty 20

## 2018-08-28 MED ORDER — PHENOL 1.4 % MT LIQD: 1.0000 | OROMUCOSAL | Status: DC | PRN

## 2018-08-28 MED ORDER — 0.9 % SODIUM CHLORIDE (POUR BTL) OPTIME
TOPICAL | Status: DC | PRN
  Administered 2018-08-28: 1000 mL

## 2018-08-28 MED ORDER — LIDOCAINE 2% (20 MG/ML) 5 ML SYRINGE
INTRAMUSCULAR | Status: DC | PRN
  Administered 2018-08-28: 60 mg via INTRAVENOUS

## 2018-08-28 SURGICAL SUPPLY — 88 items
ADH SKN CLS APL DERMABOND .7 (GAUZE/BANDAGES/DRESSINGS) ×1
APL SKNCLS STERI-STRIP NONHPOA (GAUZE/BANDAGES/DRESSINGS)
BAG DECANTER FOR FLEXI CONT (MISCELLANEOUS) ×3 IMPLANT
BASKET BONE COLLECTION (BASKET) ×2 IMPLANT
BENZOIN TINCTURE PRP APPL 2/3 (GAUZE/BANDAGES/DRESSINGS) ×1 IMPLANT
BIT DRILL 5.0/4.0 (BIT) IMPLANT
BLADE CLIPPER SURG (BLADE) ×2 IMPLANT
BLADE SURG 11 STRL SS (BLADE) ×3 IMPLANT
BUR CUTTER 7.0 ROUND (BURR) ×3 IMPLANT
BUR MATCHSTICK NEURO 3.0 LAGG (BURR) ×3 IMPLANT
CANISTER SUCT 3000ML PPV (MISCELLANEOUS) ×3 IMPLANT
CAP LOCKING (Cap) ×8 IMPLANT
CAP LOCKING 5.5 CREO (Cap) IMPLANT
CARTRIDGE OIL MAESTRO DRILL (MISCELLANEOUS) ×1 IMPLANT
CLOSURE STERI-STRIP 1/2X4 (GAUZE/BANDAGES/DRESSINGS) ×1
CLOSURE WOUND 1/2 X4 (GAUZE/BANDAGES/DRESSINGS) ×2
CLSR STERI-STRIP ANTIMIC 1/2X4 (GAUZE/BANDAGES/DRESSINGS) ×1 IMPLANT
CONT SPEC 4OZ CLIKSEAL STRL BL (MISCELLANEOUS) ×3 IMPLANT
COVER BACK TABLE 60X90IN (DRAPES) ×3 IMPLANT
COVER WAND RF STERILE (DRAPES) ×3 IMPLANT
DECANTER SPIKE VIAL GLASS SM (MISCELLANEOUS) ×3 IMPLANT
DERMABOND ADVANCED (GAUZE/BANDAGES/DRESSINGS) ×2
DERMABOND ADVANCED .7 DNX12 (GAUZE/BANDAGES/DRESSINGS) ×1 IMPLANT
DIFFUSER DRILL AIR PNEUMATIC (MISCELLANEOUS) ×3 IMPLANT
DRAPE C-ARM 42X72 X-RAY (DRAPES) ×4 IMPLANT
DRAPE C-ARMOR (DRAPES) ×2 IMPLANT
DRAPE HALF SHEET 40X57 (DRAPES) ×2 IMPLANT
DRAPE LAPAROTOMY 100X72X124 (DRAPES) ×3 IMPLANT
DRAPE SURG 17X23 STRL (DRAPES) ×3 IMPLANT
DRILL 5.0/4.0 (BIT) ×3
DRSG OPSITE 4X5.5 SM (GAUZE/BANDAGES/DRESSINGS) ×1 IMPLANT
DRSG OPSITE POSTOP 3X4 (GAUZE/BANDAGES/DRESSINGS) ×2 IMPLANT
DRSG OPSITE POSTOP 4X6 (GAUZE/BANDAGES/DRESSINGS) ×3 IMPLANT
DURAPREP 26ML APPLICATOR (WOUND CARE) ×3 IMPLANT
ELECT BLADE 4.0 EZ CLEAN MEGAD (MISCELLANEOUS) ×3
ELECT REM PT RETURN 9FT ADLT (ELECTROSURGICAL) ×3
ELECTRODE BLDE 4.0 EZ CLN MEGD (MISCELLANEOUS) IMPLANT
ELECTRODE REM PT RTRN 9FT ADLT (ELECTROSURGICAL) ×1 IMPLANT
EVACUATOR 1/8 PVC DRAIN (DRAIN) ×2 IMPLANT
EVACUATOR 3/16  PVC DRAIN (DRAIN)
EVACUATOR 3/16 PVC DRAIN (DRAIN) IMPLANT
GAUZE 4X4 16PLY RFD (DISPOSABLE) IMPLANT
GAUZE SPONGE 4X4 12PLY STRL (GAUZE/BANDAGES/DRESSINGS) ×1 IMPLANT
GAUZE SPONGE 4X4 12PLY STRL LF (GAUZE/BANDAGES/DRESSINGS) ×2 IMPLANT
GLOVE BIO SURGEON STRL SZ 6.5 (GLOVE) ×4 IMPLANT
GLOVE BIO SURGEON STRL SZ7 (GLOVE) ×4 IMPLANT
GLOVE BIO SURGEON STRL SZ8 (GLOVE) ×6 IMPLANT
GLOVE BIO SURGEONS STRL SZ 6.5 (GLOVE) ×4
GLOVE BIOGEL PI IND STRL 6.5 (GLOVE) IMPLANT
GLOVE BIOGEL PI IND STRL 7.0 (GLOVE) IMPLANT
GLOVE BIOGEL PI INDICATOR 6.5 (GLOVE) ×4
GLOVE BIOGEL PI INDICATOR 7.0 (GLOVE) ×8
GLOVE ECLIPSE 7.5 STRL STRAW (GLOVE) IMPLANT
GLOVE EXAM NITRILE XL STR (GLOVE) IMPLANT
GLOVE INDICATOR 8.5 STRL (GLOVE) ×6 IMPLANT
GOWN STRL REUS W/ TWL LRG LVL3 (GOWN DISPOSABLE) IMPLANT
GOWN STRL REUS W/ TWL XL LVL3 (GOWN DISPOSABLE) ×2 IMPLANT
GOWN STRL REUS W/TWL 2XL LVL3 (GOWN DISPOSABLE) IMPLANT
GOWN STRL REUS W/TWL LRG LVL3 (GOWN DISPOSABLE) ×9
GOWN STRL REUS W/TWL XL LVL3 (GOWN DISPOSABLE) ×6
HEMOSTAT POWDER KIT SURGIFOAM (HEMOSTASIS) IMPLANT
KIT BASIN OR (CUSTOM PROCEDURE TRAY) ×3 IMPLANT
KIT TURNOVER KIT B (KITS) ×3 IMPLANT
MILL MEDIUM DISP (BLADE) ×3 IMPLANT
NDL HYPO 21X1.5 SAFETY (NEEDLE) IMPLANT
NDL HYPO 25X1 1.5 SAFETY (NEEDLE) ×1 IMPLANT
NEEDLE HYPO 21X1.5 SAFETY (NEEDLE) ×3 IMPLANT
NEEDLE HYPO 25X1 1.5 SAFETY (NEEDLE) ×3 IMPLANT
NS IRRIG 1000ML POUR BTL (IV SOLUTION) ×3 IMPLANT
OIL CARTRIDGE MAESTRO DRILL (MISCELLANEOUS) ×3
PACK LAMINECTOMY NEURO (CUSTOM PROCEDURE TRAY) ×3 IMPLANT
PAD ARMBOARD 7.5X6 YLW CONV (MISCELLANEOUS) ×9 IMPLANT
PUTTY BONE DBX 5CC MIX (Putty) ×2 IMPLANT
ROD 40MM SPINAL (Rod) ×4 IMPLANT
SHAFT CREO 30MM (Neuro Prosthesis/Implant) ×8 IMPLANT
SPACER RT SUSTAIN 15D 10X26X14 (Spacer) ×4 IMPLANT
SPONGE LAP 4X18 RFD (DISPOSABLE) IMPLANT
SPONGE SURGIFOAM ABS GEL 100 (HEMOSTASIS) ×3 IMPLANT
STRIP CLOSURE SKIN 1/2X4 (GAUZE/BANDAGES/DRESSINGS) ×4 IMPLANT
SUT VIC AB 0 CT1 18XCR BRD8 (SUTURE) ×2 IMPLANT
SUT VIC AB 0 CT1 8-18 (SUTURE) ×3
SUT VIC AB 2-0 CT1 18 (SUTURE) ×3 IMPLANT
SUT VIC AB 4-0 PS2 27 (SUTURE) ×3 IMPLANT
TOWEL GREEN STERILE (TOWEL DISPOSABLE) ×3 IMPLANT
TOWEL GREEN STERILE FF (TOWEL DISPOSABLE) ×3 IMPLANT
TRAY FOLEY MTR SLVR 16FR STAT (SET/KITS/TRAYS/PACK) ×3 IMPLANT
TULIP CREP AMP 5.5MM (Orthopedic Implant) ×8 IMPLANT
WATER STERILE IRR 1000ML POUR (IV SOLUTION) ×3 IMPLANT

## 2018-08-28 NOTE — Progress Notes (Signed)
Notified Dr. Roanna Banning of patient's blood pressure, 116/104 and HR at 100.  Received verbal order to give 5 mg of Metoprolol for diastolic over 377.  Will continue to monitor patient.

## 2018-08-28 NOTE — H&P (Signed)
Steven Henson is an 58 y.o. male.    Chief Complaint: back and left greater than right leg pain HPI: 58 year old with back and left greater right leg pain with numbness in his anterior shin top his foot and big toe consistent with an L5 nerve root pattern. Workup revealed severe spondylosis stenosis diastasis of his facet joints and evidence of instability at L4-5. Due to patient's progression of clinical syndrome imaging findings of a conservative treatment I recommended decompression stabilization procedure at L4-5. I've extensively gone over the risks and benefits of the operation with him as well as perioperative course expectations of outcome alternatives to surgery and he understands and agrees to proceed forward.  Past Medical History:  Diagnosis Date  . Arthritis    lower back  . Coronary artery disease   . Family history of adverse reaction to anesthesia    "my mother had a hard waking up from anesthesia"  . H/O echocardiogram 12/04/1997   Normal LV systolic function. No valvular abnormalities.  . H/O myocardial perfusion scan 05/20/2009   The post stress myocardial perfusion images show a normal pattern of perfusion in all regions. the post stress left ventricle is normal in size. no significant wall motion abnormalities noted. Normal myocardial perfusion study. this is a low risk scan.  Marland Kitchen History of kidney stones    "lots"  . Hyperlipidemia   . Hypertension   . Kidney stones   . Lipoma    on back  . Sleep apnea    no CPAP use - denies apnea, waking up gasping/choking, denies daytime sleepiness    Past Surgical History:  Procedure Laterality Date  . BACK SURGERY     Micro surgery at [facility] 2012?  . CARDIAC CATHETERIZATION  > 10 yrs.  . CYSTOSCOPY/RETROGRADE/URETEROSCOPY/STONE EXTRACTION WITH BASKET  1/02, 12/01  . FRACTURE SURGERY  1974   right leg, both humerus, right wrist  . KIDNEY STONE SURGERY  2006  . LIPOMA EXCISION  05/14/2011   Procedure: EXCISION LIPOMA;   Surgeon: Belva Crome, MD;  Location: [place];  Service: General;  Laterality: Right;  EXCISION RIGHT UPPER BACK LIPOMA  . ORIF FINGER / THUMB FRACTURE  2002   right  . SHOULDER ARTHROSCOPY W/ SUBACROMIAL DECOMPRESSION AND DISTAL CLAVICLE EXCISION  7/08   left    Family History  Problem Relation Age of Onset  . Anesthesia problems Mother        effects lasted for days  . Heart disease Mother   . Heart disease Father   . Heart disease Sister   . Lung cancer Maternal Grandfather   . Heart disease Paternal Grandmother   . Heart disease Paternal Grandfather   . Heart failure Paternal Grandfather   . COPD Paternal Grandfather   . Parkinson's disease Maternal Grandmother    Social History:  reports that he has been smoking cigars. He has smoked for the past 38.00 years. He has never used smokeless tobacco. He reports current alcohol use of about 2.0 standard drinks of alcohol per week. He reports that he does not use drugs.  Allergies:  Allergies  Allergen Reactions  . Ciprofloxacin Diarrhea and Nausea And Vomiting    Hypotension  . Benzoin Hives  . Lactated Ringers Other (See Comments)    Really bad muscle spasms  . Tincture Of Benzoin [Benzoin Compound] Rash    Medications Prior to Admission  Medication Sig Dispense Refill  . acetaminophen (TYLENOL) 500 MG tablet Take 1,000 mg  by mouth every 6 (six) hours as needed for moderate pain.    Marland Kitchen amLODipine (NORVASC) 5 MG tablet Take 5 mg by mouth daily.    Marland Kitchen aspirin EC 81 MG tablet Take 81 mg by mouth daily.    Marland Kitchen atorvastatin (LIPITOR) 40 MG tablet Take 1 tablet by mouth at  bedtime 90 tablet 3  . celecoxib (CELEBREX) 200 MG capsule Take 200 mg by mouth 2 (two) times daily.    . fish oil-omega-3 fatty acids 1000 MG capsule Take 2,000 mg by mouth 2 (two) times daily.     . traMADol (ULTRAM) 50 MG tablet Take 50 mg by mouth every 6 (six) hours as needed. for pain      No results found for this or any previous  visit (from the past 48 hour(s)). No results found.  Review of Systems  Musculoskeletal: Positive for back pain.  Neurological: Positive for tingling and sensory change.    Blood pressure (!) 167/100, pulse 86, temperature 97.6 F (36.4 C), temperature source Oral, resp. rate 20, height 5\' 10"  (1.778 m), weight 97.5 kg, SpO2 96 %. Physical Exam  Constitutional: He is oriented to person, place, and time. He appears well-developed and well-nourished.  HENT:  Head: Normocephalic.  Eyes: Pupils are equal, round, and reactive to light.  Neck: Normal range of motion.  Respiratory: Effort normal.  GI: Soft.  Neurological: He is alert and oriented to person, place, and time. He has normal strength. GCS eye subscore is 4. GCS verbal subscore is 5. GCS motor subscore is 6.  Strength is 5 out of 5 iliopsoas, quads, hip she's, gastric, into tibialis, and EHL.  Skin: Skin is warm and dry.     Assessment/Plan 58 year old presents for decompression stabilization procedure L4-5.  Steven Henson P, MD 08/28/2018, 7:27 AM

## 2018-08-28 NOTE — Transfer of Care (Signed)
Immediate Anesthesia Transfer of Care Note  Patient: Steven Henson  Procedure(s) Performed: POSTERIOR LUMBAR INTERBODY FUSION - LUMBAR FOUR-LUMBAR FIVE (N/A Back)  Patient Location: PACU  Anesthesia Type:General  Level of Consciousness: awake, alert  and oriented  Airway & Oxygen Therapy: Patient Spontanous Breathing and Patient connected to nasal cannula oxygen  Post-op Assessment: Report given to RN, Post -op Vital signs reviewed and stable and Patient moving all extremities X 4  Post vital signs: Reviewed and stable  Last Vitals:  Vitals Value Taken Time  BP 121/106 08/28/2018 10:45 AM  Temp    Pulse 96 08/28/2018 10:47 AM  Resp 15 08/28/2018 10:47 AM  SpO2 97 % 08/28/2018 10:47 AM  Vitals shown include unvalidated device data.  Last Pain:  Vitals:   08/28/18 0627  TempSrc:   PainSc: 8       Patients Stated Pain Goal: 4 (29/93/71 6967)  Complications: No apparent anesthesia complications

## 2018-08-28 NOTE — Op Note (Signed)
Preoperative diagnosis: Spinal stenosis L4-5 instability L4-5 bilateral L4-L5 radiculopathies  Postoperative diagnosis: Same  Procedure: 1 decompressive lumbar laminectomy redo L4-5 in excess and requiring more work to would be needed with a standard interbody fusion including complete medial facetectomies radical foraminotomies of the L4 and L5 nerve roots.  #2 posterior lumbar interbody fusion L4-5 utilizing the globus sustain titanium cages packed with locally harvested autograft mixed with DBX mix  #3 cortical screw fixation utilizing the globus [place] and modular cortical screw set  Surgeon: Dominica Severin Rihaan Barrack  Asst.: Nash Shearer  Anesthesia: Gen.  EBL: Minimal  History of present illness: Patient is very pleasant 58 year old gently previously undergone laminotomy on the left at L4-5 who presents with progressive worsening back pain primarily and left greater than right radiculopathy with numbness tingling and L4 and L5 nerve root pattern. Patient failed all forms of conservative treatment workup revealed diastases is facet joints instability L4-5 severe spinal stenosis and possible recurrent disc. Due to patient's progression of clinical syndrome imaging findings and failure conservative treatment I recommended redo decompressive laminectomy and interbody fusion at L4-5. We extensively went over the risks and benefits of the operation with him as well as perioperative course dictation of outcome and alternatives of surgery and he understood and agreed to proceed forward.  Operative procedure: Patient brought into the or was induced on general anesthesia positioned prone on the Wilson frame his back was prepped and draped in routine sterile fashion is old incision was infiltrated with 10 mL lidocaine with epi and extended and subperiosteal dissections care lamina of L4 and L5 the scar tissues dissected off of the previous laminotomy defect on the left at L4-5. The spinous process at L4 was a  removed central decompression and complete medial facetectomy is an performed on the right side then working through the scar tissue marching down the left side I performed a complete facetectomy on the left identify the L4 and L5 nerve roots freed up all scar tissue and freed up both the L4 and L5 nerve roots intractable out the foramen. I then aggressively under bit the superior tickling facet of L5 to gain access lateral margins disc space. Then marching to the right and a similar fashion unroofed the L4 and L5 nerve roots under bit the lower the superior tickling facet at L5 on the right to gain access to lateral aspect the disc space 2. Disc space and cleaned out bilaterally with paddle shavers Epstein curettes pituitary rongeurs and then with a 12 distractor in place a selected the 10 mm to 14 rotating titanium cage and packed with locally harvested autograft mixed with DBX mix. Inserted and rotated packed the remainder of the autograft mix centrally and inserted the contralateral cage. Fluoroscopy was used at each step along the way to confirm position. Attention was then taken the cortical screw placement pilot holes were drilled a high-speed drill and 25 mm holes were drilled all cortical screws were placed under fluoroscopic guidance and all screws excellent purchase. I then copiously irrigated the wound simple the heads of the screws fashioned a rod and compressed L4 against L5 and anchored every thing in place. I then reexposed all the foramina to confirm patency no migration of graft material overlaid Gelfoam and top of the dura placed a medium Hemovac drain injected X Burrell the fascia and closed the wound in layers with interrupted Vicryl and a running 4 subcuticular Dermabond benzo and Steri-Strips and sterile dressing was applied patient recovered in stable condition. At  the end the case all needle counts sponge counts were correct.

## 2018-08-28 NOTE — Anesthesia Postprocedure Evaluation (Signed)
Anesthesia Post Note  Patient: Steven Henson  Procedure(s) Performed: POSTERIOR LUMBAR INTERBODY FUSION - LUMBAR FOUR-LUMBAR FIVE (N/A Back)     Patient location during evaluation: PACU Anesthesia Type: General Level of consciousness: awake and alert Pain management: pain level controlled Vital Signs Assessment: post-procedure vital signs reviewed and stable Respiratory status: spontaneous breathing, nonlabored ventilation, respiratory function stable and patient connected to nasal cannula oxygen Cardiovascular status: blood pressure returned to baseline and stable Postop Assessment: no apparent nausea or vomiting Anesthetic complications: no    Last Vitals:  Vitals:   08/28/18 1107 08/28/18 1118  BP: 112/70 (!) 91/57  Pulse: 83 76  Resp: (!) 8 (!) 9  Temp:  36.4 C  SpO2: 94% 94%    Last Pain:  Vitals:   08/28/18 1222  TempSrc:   PainSc: 7                  Ryan P Ellender

## 2018-08-28 NOTE — Evaluation (Signed)
Physical Therapy Evaluation Patient Details Name: Steven Henson MRN: [id number] DOB: 01-19-1961 Today's Date: 08/28/2018   History of Present Illness  58 year old with back and left greater right leg pain with numbness in his anterior shin top his foot and big toe consistent with an L5 nerve root pattern. Pt s/p redo L4-5 decompressive lumbar lami.  Clinical Impression  Patient is s/p above surgery resulting in the deficits listed below (see PT Problem List). Pt tolerated mobility well and anticiapte will progress quickly. Patient will benefit from skilled PT to increase their independence and safety with mobility (while adhering to their precautions) to allow discharge to the venue listed below.     Follow Up Recommendations No PT follow up    Equipment Recommendations  None recommended by PT    Recommendations for Other Services       Precautions / Restrictions Precautions Precautions: Back Precaution Booklet Issued: Yes (comment) Precaution Comments: pt with good understasnding Restrictions Weight Bearing Restrictions: No      Mobility  Bed Mobility Overal bed mobility: Modified Independent             General bed mobility comments: verbal cues for log roll technique  Transfers Overall transfer level: Needs assistance Equipment used: None Transfers: Sit to/from Stand Sit to Stand: Min guard         General transfer comment: increased time, no physical assist needed  Ambulation/Gait Ambulation/Gait assistance: Supervision Gait Distance (Feet): 200 Feet Assistive device: None Gait Pattern/deviations: Step-through pattern Gait velocity: decreased Gait velocity interpretation: 1.31 - 2.62 ft/sec, indicative of limited community ambulator General Gait Details: initially slow and guarded transitioned to relaxed shoulders and smoother gait pattern  Stairs Stairs: Yes Stairs assistance: Min guard Stair Management: One rail Left;Alternating pattern Number  of Stairs: 12 General stair comments: pt with good technique, educated on "Up with the good, down with the bad"  Wheelchair Mobility    Modified Rankin (Stroke Patients Only)       Balance Overall balance assessment: Mild deficits observed, not formally tested                                           Pertinent Vitals/Pain Pain Assessment: 0-10 Pain Score: 3  Pain Location: incisional Pain Descriptors / Indicators: Sore Pain Intervention(s): Monitored during session    Home Living Family/patient expects to be discharged to:: Private residence Living Arrangements: Spouse/significant other Available Help at Discharge: Family;Available 24 hours/day Type of Home: House Home Access: Stairs to enter Entrance Stairs-Rails: None Entrance Stairs-Number of Steps: 2 Home Layout: Two level Home Equipment: None      Prior Function Level of Independence: Independent               Hand Dominance   Dominant Hand: Right    Extremity/Trunk Assessment   Upper Extremity Assessment Upper Extremity Assessment: Overall WFL for tasks assessed    Lower Extremity Assessment Lower Extremity Assessment: Generalized weakness    Cervical / Trunk Assessment Cervical / Trunk Assessment: Other exceptions Cervical / Trunk Exceptions: back  Communication   Communication: No difficulties  Cognition Arousal/Alertness: Awake/alert Behavior During Therapy: WFL for tasks assessed/performed Overall Cognitive Status: Within Functional Limits for tasks assessed  General Comments General comments (skin integrity, edema, etc.): VSS    Exercises     Assessment/Plan    PT Assessment Patient needs continued PT services  PT Problem List Decreased strength;Decreased activity tolerance;Decreased balance;Decreased mobility       PT Treatment Interventions DME instruction;Gait training;Therapeutic  activities;Functional mobility training;Stair training;Therapeutic exercise;Balance training    PT Goals (Current goals can be found in the Care Plan section)  Acute Rehab PT Goals Patient Stated Goal: home PT Goal Formulation: With patient Time For Goal Achievement: 09/04/18 Potential to Achieve Goals: Good    Frequency Min 5X/week   Barriers to discharge        Co-evaluation               AM-PAC PT "6 Clicks" Mobility  Outcome Measure Help needed turning from your back to your side while in a flat bed without using bedrails?: None Help needed moving from lying on your back to sitting on the side of a flat bed without using bedrails?: None Help needed moving to and from a bed to a chair (including a wheelchair)?: None Help needed standing up from a chair using your arms (e.g., wheelchair or bedside chair)?: None Help needed to walk in hospital room?: A Little Help needed climbing 3-5 steps with a railing? : A Little 6 Click Score: 22    End of Session Equipment Utilized During Treatment: Back brace Activity Tolerance: Patient tolerated treatment well Patient left: (walking in hallway with spouse) Nurse Communication: Mobility status PT Visit Diagnosis: Unsteadiness on feet (R26.81)    Time: 5436-0677 PT Time Calculation (min) (ACUTE ONLY): 19 min   Charges:   PT Evaluation $PT Eval Low Complexity: 1 Low          Kittie Plater, PT, DPT Acute Rehabilitation Services Pager #: [phone] Office #: [phone]   Berline Lopes 08/28/2018, 2:23 PM

## 2018-08-28 NOTE — Plan of Care (Signed)
  Problem: Safety: Goal: Ability to remain free from injury will improve Outcome: Progressing   Problem: Activity: Goal: Ability to avoid complications of mobility impairment will improve Outcome: Progressing Goal: Ability to tolerate increased activity will improve Outcome: Progressing Goal: Will remain free from falls Outcome: Progressing   Problem: Pain Management: Goal: Pain level will decrease Outcome: Progressing   Problem: Bladder/Genitourinary: Goal: Urinary functional status for postoperative course will improve Outcome: Progressing

## 2018-08-28 NOTE — Anesthesia Procedure Notes (Signed)
Procedure Name: Intubation Date/Time: 08/28/2018 7:40 AM Performed by: Neldon Newport, CRNA Pre-anesthesia Checklist: Timeout performed, Patient being monitored, Suction available, Emergency Drugs available and Patient identified Patient Re-evaluated:Patient Re-evaluated prior to induction Oxygen Delivery Method: Circle system utilized Preoxygenation: Pre-oxygenation with 100% oxygen Induction Type: IV induction Ventilation: Mask ventilation without difficulty and Oral airway inserted - appropriate to patient size Laryngoscope Size: Mac and 4 Grade View: Grade I Tube type: Oral Tube size: 7.5 mm Number of attempts: 1 Placement Confirmation: breath sounds checked- equal and bilateral,  positive ETCO2 and ETT inserted through vocal cords under direct vision Secured at: 23 cm Tube secured with: Tape Dental Injury: Teeth and Oropharynx as per pre-operative assessment

## 2018-08-29 MED ORDER — CYCLOBENZAPRINE HCL 10 MG PO TABS: 10.0000 mg | ORAL_TABLET | Freq: Three times a day (TID) | ORAL | 0 refills | Status: DC | PRN

## 2018-08-29 NOTE — Progress Notes (Signed)
Pt doing well. Pt given D/C instructions with verbal understanding. Pt's incision is clean and dry with no sign of infection. Pt's IV was removed prior to D/C. Pt D/C'd home via walking per MD order. Pt is stable @ D/C and has no other needs at this time. Holli Humbles, RN

## 2018-08-29 NOTE — Progress Notes (Signed)
Physical Therapy Treatment and DISCHARGE Patient Details Name: Steven Henson MRN: [id number] DOB: Dec 31, 1960 Today's Date: 08/29/2018    History of Present Illness 58 year old with back and left greater right leg pain with numbness in his anterior shin top his foot and big toe consistent with an L5 nerve root pattern. Pt s/p redo L4-5 decompressive lumbar lami.    PT Comments    Pt has met all goals and functioning at mod I level. Pt able to complete LB dressing and donning of shoes within back precautions. Pt reports minimal pain as well. Pt with no further acute PT needs at this time. PT SIGNING OFF. Please re-consult if needed in future.   Follow Up Recommendations  No PT follow up     Equipment Recommendations  None recommended by PT    Recommendations for Other Services       Precautions / Restrictions Precautions Precautions: Back Precaution Booklet Issued: Yes (comment) Precaution Comments: pt with good recall Required Braces or Orthoses: Spinal Brace Spinal Brace: Applied in sitting position Restrictions Weight Bearing Restrictions: No    Mobility  Bed Mobility Overal bed mobility: Modified Independent             General bed mobility comments: demo'd excellent log roll technique  Transfers Overall transfer level: Modified independent Equipment used: None Transfers: Sit to/from Stand Sit to Stand: Modified independent (Device/Increase time)         General transfer comment: good technique  Ambulation/Gait Ambulation/Gait assistance: Independent Gait Distance (Feet): 300 Feet Assistive device: None Gait Pattern/deviations: WFL(Within Functional Limits) Gait velocity: wfl Gait velocity interpretation: >2.62 ft/sec, indicative of community ambulatory General Gait Details: discussed isometric contraction of abdoment to prevent anterior pelvic tilt, pt report altered/numbness sensation in L LE but not pain   Stairs Stairs: Yes Stairs  assistance: Supervision Stair Management: One rail Left;Alternating pattern Number of Stairs: 12 General stair comments: good technique   Wheelchair Mobility    Modified Rankin (Stroke Patients Only)       Balance Overall balance assessment: No apparent balance deficits (not formally assessed)                                          Cognition Arousal/Alertness: Awake/alert Behavior During Therapy: WFL for tasks assessed/performed Overall Cognitive Status: Within Functional Limits for tasks assessed                                        Exercises      General Comments General comments (skin integrity, edema, etc.): vss      Pertinent Vitals/Pain Pain Assessment: 0-10 Pain Score: 2  Pain Location: incisional Pain Descriptors / Indicators: Sore Pain Intervention(s): Monitored during session    Home Living                      Prior Function            PT Goals (current goals can now be found in the care plan section) Acute Rehab PT Goals PT Goal Formulation: All assessment and education complete, DC therapy Progress towards PT goals: Progressing toward goals    Frequency    (d/c from acute PT services)      PT Plan Current plan remains appropriate    Co-evaluation  AM-PAC PT "6 Clicks" Mobility   Outcome Measure  Help needed turning from your back to your side while in a flat bed without using bedrails?: None Help needed moving from lying on your back to sitting on the side of a flat bed without using bedrails?: None Help needed moving to and from a bed to a chair (including a wheelchair)?: None Help needed standing up from a chair using your arms (e.g., wheelchair or bedside chair)?: None Help needed to walk in hospital room?: None Help needed climbing 3-5 steps with a railing? : A Little 6 Click Score: 23    End of Session Equipment Utilized During Treatment: Back brace Activity  Tolerance: Patient tolerated treatment well Patient left: in bed;with call bell/phone within reach Nurse Communication: Mobility status PT Visit Diagnosis: Unsteadiness on feet (R26.81)     Time: 0746-0808 PT Time Calculation (min) (ACUTE ONLY): 22 min  Charges:  $Gait Training: 8-22 mins                     Ashly Chadwell, PT, DPT Acute Rehabilitation Services Pager #: [phone] Office #: [phone]    Ashly M Chadwell 08/29/2018, 8:48 AM   

## 2018-08-29 NOTE — Discharge Summary (Signed)
Physician Discharge Summary  Patient ID: Steven Henson MRN: [id number] DOB/AGE: Nov 23, 1960 58 y.o.  Admit date: 08/28/2018 Discharge date: 08/29/2018  Admission Diagnoses:lumbar spinal stenosis degenerative disc disease and instability L4-5  Discharge Diagnoses: same Active Problems:   Spinal stenosis at L4-L5 level   Discharged Condition: good  Hospital Course: patient was admitted as an EMA went to the operating room underwent decompression stabilization procedure at L4-5. Postoperatively patient did very well with recovered in the floor on the floor was angling and voiding spontaneously tolerating regular diet stable for discharge home. He'll be discharged her scheduled follow-up in one to 2 weeks.  Consults: None  Significant Diagnostic Studies:   Treatments: surgery: L4-5 posterior lumbar interbody fusion  Discharge Exam: Blood pressure (!) 132/93, pulse 89, temperature 97.9 F (36.6 C), temperature source Oral, resp. rate 19, height 5\' 10"  (1.778 m), weight 97.5 kg, SpO2 98 %. awake alert strength out of 5 wound clean dry and intact  Disposition: Discharge disposition: 01-Home or Self Care        Allergies as of 08/29/2018      Reactions   Ciprofloxacin Diarrhea, Nausea And Vomiting   Hypotension   Benzoin Hives   Lactated Ringers Other (See Comments)   Really bad muscle spasms   Tincture Of Benzoin [benzoin Compound] Rash      Medication List    TAKE these medications   acetaminophen 500 MG tablet Commonly known as:  TYLENOL Take 1,000 mg by mouth every 6 (six) hours as needed for moderate pain.   amLODipine 5 MG tablet Commonly known as:  NORVASC Take 5 mg by mouth daily.   aspirin EC 81 MG tablet Take 81 mg by mouth daily.   atorvastatin 40 MG tablet Commonly known as:  LIPITOR Take 1 tablet by mouth at  bedtime   celecoxib 200 MG capsule Commonly known as:  CELEBREX Take 200 mg by mouth 2 (two) times daily.   cyclobenzaprine 10 MG  tablet Commonly known as:  FLEXERIL Take 1 tablet (10 mg total) by mouth 3 (three) times daily as needed for muscle spasms.   fish oil-omega-3 fatty acids 1000 MG capsule Take 2,000 mg by mouth 2 (two) times daily.   traMADol 50 MG tablet Commonly known as:  ULTRAM Take 50 mg by mouth every 6 (six) hours as needed. for pain        Signed: Kavan Devan P 08/29/2018, 7:44 AM

## 2018-08-29 NOTE — Progress Notes (Signed)
OT Cancellation Note and Discharge  Patient Details Name: QUANTRELL SPLITT MRN: [id number] DOB: 1961-01-17   Cancelled Treatment:    Reason Eval/Treat Not Completed: OT screened, no needs identified, will sign off. Received text from PT that no OT needs were identified.   [place], OTR/L Acute Rehab Services Pager [phone] Office [phone]     Almon Register 08/29/2018, 8:54 AM

## 2018-09-28 DIAGNOSIS — M4316 Spondylolisthesis, lumbar region: Secondary | ICD-10-CM | POA: Diagnosis not present

## 2018-10-30 DIAGNOSIS — I10 Essential (primary) hypertension: Secondary | ICD-10-CM | POA: Diagnosis not present

## 2018-10-30 DIAGNOSIS — I2510 Atherosclerotic heart disease of native coronary artery without angina pectoris: Secondary | ICD-10-CM | POA: Diagnosis not present

## 2018-10-30 DIAGNOSIS — E7800 Pure hypercholesterolemia, unspecified: Secondary | ICD-10-CM | POA: Diagnosis not present

## 2018-11-09 DIAGNOSIS — M4316 Spondylolisthesis, lumbar region: Secondary | ICD-10-CM | POA: Diagnosis not present

## 2018-11-13 DIAGNOSIS — N200 Calculus of kidney: Secondary | ICD-10-CM | POA: Diagnosis not present

## 2018-11-13 DIAGNOSIS — N400 Enlarged prostate without lower urinary tract symptoms: Secondary | ICD-10-CM | POA: Diagnosis not present

## 2018-11-15 ENCOUNTER — Ambulatory Visit: Payer: 59 | Attending: Neurosurgery | Admitting: Physical Therapy

## 2018-11-15 ENCOUNTER — Other Ambulatory Visit: Payer: Self-pay

## 2018-11-15 ENCOUNTER — Encounter: Payer: Self-pay | Admitting: Physical Therapy

## 2018-11-15 DIAGNOSIS — M5442 Lumbago with sciatica, left side: Secondary | ICD-10-CM | POA: Diagnosis present

## 2018-11-15 DIAGNOSIS — M6281 Muscle weakness (generalized): Secondary | ICD-10-CM | POA: Diagnosis present

## 2018-11-15 NOTE — Therapy (Signed)
[place], [place], [address] Phone: [phone]   Fax:  [phone]  Physical Therapy Evaluation  Patient Details  Name: Steven Henson MRN: [id number] Date of Birth: 12-24-1960 Referring Provider (PT): Saintclair Halsted   Encounter Date: 11/15/2018  PT End of Session - 11/15/18 1357    Visit Number  1    Date for PT Re-Evaluation  01/15/19    PT Start Time  9357    PT Stop Time  0177    PT Time Calculation (min)  53 min    Activity Tolerance  Patient tolerated treatment well    Behavior During Therapy  [facility] for tasks assessed/performed       Past Medical History:  Diagnosis Date  . Arthritis    lower back  . Coronary artery disease   . Family history of adverse reaction to anesthesia    "my mother had a hard waking up from anesthesia"  . H/O echocardiogram 12/04/1997   Normal LV systolic function. No valvular abnormalities.  . H/O myocardial perfusion scan 05/20/2009   The post stress myocardial perfusion images show a normal pattern of perfusion in all regions. the post stress left ventricle is normal in size. no significant wall motion abnormalities noted. Normal myocardial perfusion study. this is a low risk scan.  Marland Kitchen History of kidney stones    "lots"  . Hyperlipidemia   . Hypertension   . Kidney stones   . Lipoma    on back  . Sleep apnea    no CPAP use - denies apnea, waking up gasping/choking, denies daytime sleepiness    Past Surgical History:  Procedure Laterality Date  . BACK SURGERY     Micro surgery at [facility] 2012?  . CARDIAC CATHETERIZATION  > 10 yrs.  . CYSTOSCOPY/RETROGRADE/URETEROSCOPY/STONE EXTRACTION WITH BASKET  1/02, 12/01  . FRACTURE SURGERY  1974   right leg, both humerus, right wrist  . KIDNEY STONE SURGERY  2006  . LIPOMA EXCISION  05/14/2011   Procedure: EXCISION LIPOMA;  Surgeon: Belva Crome, MD;  Location: [place];  Service: General;   Laterality: Right;  EXCISION RIGHT UPPER BACK LIPOMA  . ORIF FINGER / THUMB FRACTURE  2002   right  . SHOULDER ARTHROSCOPY W/ SUBACROMIAL DECOMPRESSION AND DISTAL CLAVICLE EXCISION  7/08   left    There were no vitals filed for this visit.   Subjective Assessment - 11/15/18 1303    Subjective  Patient underwent a fusion at L4-5 on 08/28/18.  He reports that he had back pain for years.  HE reports that he has much less pain since the surgery, he feels like he is up straight and able to do more but is very weak.,  He does have some numbness in the left leg    Limitations  Walking;House hold activities;Lifting;Standing    Patient Stated Goals  get stronger, feel better    Currently in Pain?  Yes    Pain Score  0-No pain    Pain Location  Back    Pain Orientation  Lower    Pain Descriptors / Indicators  Aching;Numbness    Pain Radiating Towards  medial left knee into the left great toe    Pain Onset  More than a month ago    Pain Frequency  Occasional    Aggravating Factors   bending, bumping into something caused a sharp shooting pain an 8/10 that lasted less than  a minute    Pain Relieving Factors  rest    Effect of Pain on Daily Activities  would like to go back to normal, have been limited         [facility] PT Assessment - 11/15/18 0001      Assessment   Medical Diagnosis  s/p lumbar fusion    Referring Provider (PT)  Saintclair Halsted    Onset Date/Surgical Date  08/28/18    Prior Therapy  over a year ago      Precautions   Precautions  None      Balance Screen   Has the patient fallen in the past 6 months  No    Has the patient had a decrease in activity level because of a fear of falling?   No    Is the patient reluctant to leave their home because of a fear of falling?   No      Home Environment   Additional Comments  would normally do yardwork but has not done it in over a year      Prior Function   Level of Independence  Independent    Vocation  Retired    Leisure  would like  to return to playing pickle ball 2-3x/week, some walking      Posture/Postural Control   Posture Comments  decreased lordosis, some stooped posture      ROM / Strength   AROM / PROM / Strength  AROM;Strength      AROM   Overall AROM Comments  Lumbar ROM decreased 25% for flexion, decresaed 50% for other motions with c/o "feel stiff and tight"      Strength   Strength Assessment Site  Hip;Knee;Ankle    Right/Left Hip  Left    Left Hip Flexion  3+/5    Left Hip Extension  4-/5    Left Hip ABduction  3+/5    Right/Left Knee  Left    Left Knee Flexion  3+/5    Left Knee Extension  4/5    Right/Left Ankle  Left    Left Ankle Dorsiflexion  4-/5    Left Ankle Plantar Flexion  4-/5    Left Ankle Inversion  4-/5    Left Ankle Eversion  4-/5      Flexibility   Soft Tissue Assessment /Muscle Length  yes    Hamstrings  tight    Quadriceps  tight    Piriformis  tight      Palpation   Palpation comment  tight in the lumbar parapsinals, into the buttock      Ambulation/Gait   Gait Comments  no device, tends to have toe out gait, some hip and knee flexion, reports that he will stoop over some if he is up for long periods but reports much better now after the surgery                Objective measurements completed on examination: See above findings.      [facility] Adult PT Treatment/Exercise - 11/15/18 0001      Exercises   Exercises  Lumbar      Lumbar Exercises: Aerobic   Nustep  level 5 x 6 minutes      Lumbar Exercises: Machines for Strengthening   Cybex Knee Extension  10# 2x10    Cybex Knee Flexion  25# 2x10             PT Education - 11/15/18 1355    Education Details  asked  him to do HEP that consisted of hip flexion , abduction and toe raises due to the weakness of the left LE    Person(s) Educated  Patient    Methods  Explanation;Demonstration    Comprehension  Verbalized understanding          PT Long Term Goals - 11/15/18 1401      PT LONG  TERM GOAL #1   Title  return to gym setting safely    Time  8    Period  Weeks    Status  New      PT LONG TERM GOAL #2   Title  return to pickelball without pain    Time  8    Period  Weeks    Status  New      PT LONG TERM GOAL #3   Title  increase left hip flexor strength to 4/5    Time  8    Period  Weeks    Status  New      PT LONG TERM GOAL #4   Title  mow the yard without difficulty    Time  8    Period  Weeks    Status  New             Plan - 11/15/18 1357    Clinical Impression Statement  Patient underwent an L4-5 fusion in February, he reports that he had immediate relief of pain and was able to stand up better.  He does have some numbness in the left medial knee to the left great toe, he has weakness of the left hip, left knee flexion and left ankle.  He would like to return to playing pickle ball and exercising.  He feels weak and would like to get stronger so he cn return to his recreation activities that are normal to him, as well as mow the lawn.    Stability/Clinical Decision Making  Evolving/Moderate complexity    Clinical Decision Making  Low    Rehab Potential  Good    PT Frequency  2x / week    PT Duration  8 weeks    PT Treatment/Interventions  ADLs/Self Care Home Management;Cryotherapy;Electrical Stimulation;Moist Heat;Therapeutic activities;Therapeutic exercise;Neuromuscular re-education;Patient/family education;Manual techniques    PT Next Visit Plan  slowly start exercises to help with overall strength and continutioning, flexibility and core stability    Consulted and Agree with Plan of Care  Patient       Patient will benefit from skilled therapeutic intervention in order to improve the following deficits and impairments:  Abnormal gait, Difficulty walking, Increased muscle spasms, Improper body mechanics, Decreased range of motion, Decreased activity tolerance, Decreased strength, Impaired flexibility, Postural dysfunction, Pain  Visit  Diagnosis: Muscle weakness (generalized) - Plan: PT plan of care cert/re-cert  Acute bilateral low back pain with left-sided sciatica - Plan: PT plan of care cert/re-cert     Problem List Patient Active Problem List   Diagnosis Date Noted  . Spinal stenosis at L4-L5 level 08/28/2018  . Lightheadedness 10/20/2017  . Orthostatic lightheadedness 04/22/2017  . Dyspnea on exertion 04/22/2017  . Dizziness 09/05/2013  . Coronary artery disease involving native coronary artery of native heart without angina pectoris 07/18/2013  . Palpitations 05/09/2012  . Atypical chest pain 04/24/2012  . Mixed hyperlipidemia 04/24/2012  . Elevated blood pressure 04/24/2012  . Postop check 05/25/2011  . Rilght upper back lipoma 04/13/2011    Sumner Boast., PT 11/15/2018, 2:07 PM  [place]  [place], [place], [address] Phone: [phone]   Fax:  [phone]  Name: Steven Henson MRN: [id number] Date of Birth: 09-16-1960

## 2018-11-20 ENCOUNTER — Encounter: Payer: Self-pay | Admitting: Physical Therapy

## 2018-11-20 ENCOUNTER — Ambulatory Visit: Payer: 59 | Admitting: Physical Therapy

## 2018-11-20 ENCOUNTER — Other Ambulatory Visit: Payer: Self-pay

## 2018-11-20 DIAGNOSIS — M6281 Muscle weakness (generalized): Secondary | ICD-10-CM

## 2018-11-20 DIAGNOSIS — M5442 Lumbago with sciatica, left side: Secondary | ICD-10-CM

## 2018-11-20 NOTE — Therapy (Signed)
[place], [place], [address] Phone: [phone]   Fax:  [phone]  Physical Therapy Treatment  Patient Details  Name: Steven Henson MRN: [id number] Date of Birth: 06/19/1961 Referring Provider (PT): Saintclair Halsted   Encounter Date: 11/20/2018  PT End of Session - 11/20/18 0842    Visit Number  2    Date for PT Re-Evaluation  01/15/19    PT Start Time  0800    PT Stop Time  0845    PT Time Calculation (min)  45 min    Activity Tolerance  Patient tolerated treatment well    Behavior During Therapy  [facility] for tasks assessed/performed       Past Medical History:  Diagnosis Date  . Arthritis    lower back  . Coronary artery disease   . Family history of adverse reaction to anesthesia    "my mother had a hard waking up from anesthesia"  . H/O echocardiogram 12/04/1997   Normal LV systolic function. No valvular abnormalities.  . H/O myocardial perfusion scan 05/20/2009   The post stress myocardial perfusion images show a normal pattern of perfusion in all regions. the post stress left ventricle is normal in size. no significant wall motion abnormalities noted. Normal myocardial perfusion study. this is a low risk scan.  Marland Kitchen History of kidney stones    "lots"  . Hyperlipidemia   . Hypertension   . Kidney stones   . Lipoma    on back  . Sleep apnea    no CPAP use - denies apnea, waking up gasping/choking, denies daytime sleepiness    Past Surgical History:  Procedure Laterality Date  . BACK SURGERY     [facility] surgery at [facility] 2012?  . CARDIAC CATHETERIZATION  > 10 yrs.  . CYSTOSCOPY/RETROGRADE/URETEROSCOPY/STONE EXTRACTION WITH BASKET  1/02, 12/01  . FRACTURE SURGERY  1974   right leg, both humerus, right wrist  . KIDNEY STONE SURGERY  2006  . LIPOMA EXCISION  05/14/2011   Procedure: EXCISION LIPOMA;  Surgeon: Belva Crome, MD;  Location: [place];  Service: General;   Laterality: Right;  EXCISION RIGHT UPPER BACK LIPOMA  . ORIF FINGER / THUMB FRACTURE  2002   right  . SHOULDER ARTHROSCOPY W/ SUBACROMIAL DECOMPRESSION AND DISTAL CLAVICLE EXCISION  7/08   left    There were no vitals filed for this visit.  Subjective Assessment - 11/20/18 0801    Subjective  Pt reports that's he is doing fine    Currently in Pain?  No/denies    Pain Score  0-No pain                       [facility] Adult PT Treatment/Exercise - 11/20/18 0001      High Level Balance   High Level Balance Comments  Standing rebound ball toss on airec 2x15      Lumbar Exercises: Aerobic   Nustep  level 5 x 6 minutes      Lumbar Exercises: Machines for Strengthening   Cybex Knee Extension  10# 2x15    Cybex Knee Flexion  25# 2x15    Leg Press  40lb 2x15    Other Lumbar Machine Exercise  Rows and lats 25lb 2x10-`      Lumbar Exercises: Standing   Heel Raises  15 reps;1 second    Row  Theraband;20 reps;Both;Strengthening    Theraband Level (Row)  Level  4 (Blue)    Other Standing Lumbar Exercises  Step ups 6in x10 each     Other Standing Lumbar Exercises  AR press 15lb x10 each, Shoulder Ext pulley15lb 2x10; Resisted side step 40lb x 5 each      Lumbar Exercises: Seated   Sit to Stand  10 reps   X2, with OHP with yellow ball                  PT Long Term Goals - 11/20/18 9201      PT LONG TERM GOAL #1   Title  return to gym setting safely    Status  On-going      PT LONG TERM GOAL #2   Title  return to pickelball without pain    Status  On-going      PT LONG TERM GOAL #4   Title  mow the yard without difficulty            Plan - 11/20/18 0848    Clinical Impression Statement  Pt with good effort throughout session. He tolerated today's treatment well evident by no subjective reports of increase pain. Postural cues needed with seated rows to keep shoulder back during the eccentric phase. Pt has some noticeable hip weakness with resisted  side steps. Fatigues quick with sit to stands.     Stability/Clinical Decision Making  Evolving/Moderate complexity    Rehab Potential  Good    PT Frequency  2x / week    PT Duration  8 weeks    PT Treatment/Interventions  ADLs/Self Care Home Management;Cryotherapy;Electrical Stimulation;Moist Heat;Therapeutic activities;Therapeutic exercise;Neuromuscular re-education;Patient/family education;Manual techniques    PT Next Visit Plan  exercises to help with overall strength and continutioning, flexibility and core stability       Patient will benefit from skilled therapeutic intervention in order to improve the following deficits and impairments:  Abnormal gait, Difficulty walking, Increased muscle spasms, Improper body mechanics, Decreased range of motion, Decreased activity tolerance, Decreased strength, Impaired flexibility, Postural dysfunction, Pain  Visit Diagnosis: Muscle weakness (generalized)  Acute bilateral low back pain with left-sided sciatica     Problem List Patient Active Problem List   Diagnosis Date Noted  . Spinal stenosis at L4-L5 level 08/28/2018  . Lightheadedness 10/20/2017  . Orthostatic lightheadedness 04/22/2017  . Dyspnea on exertion 04/22/2017  . Dizziness 09/05/2013  . Coronary artery disease involving native coronary artery of native heart without angina pectoris 07/18/2013  . Palpitations 05/09/2012  . Atypical chest pain 04/24/2012  . Mixed hyperlipidemia 04/24/2012  . Elevated blood pressure 04/24/2012  . Postop check 05/25/2011  . Rilght upper back lipoma 04/13/2011    Scot Jun, PTA 11/20/2018, 8:53 AM  [place], [place], [address] Phone: [phone]   Fax:  [phone]  Name: Steven Henson MRN: [id number] Date of Birth: Nov 27, 1960

## 2018-11-23 ENCOUNTER — Ambulatory Visit: Payer: 59 | Admitting: Physical Therapy

## 2018-11-23 ENCOUNTER — Encounter: Payer: Self-pay | Admitting: Physical Therapy

## 2018-11-23 ENCOUNTER — Other Ambulatory Visit: Payer: Self-pay

## 2018-11-23 DIAGNOSIS — M5442 Lumbago with sciatica, left side: Secondary | ICD-10-CM

## 2018-11-23 DIAGNOSIS — M6281 Muscle weakness (generalized): Secondary | ICD-10-CM | POA: Diagnosis not present

## 2018-11-23 NOTE — Therapy (Signed)
[place], [place], [address] Phone: [phone]   Fax:  [phone]  Physical Therapy Treatment  Patient Details  Name: Steven Henson MRN: [id number] Date of Birth: 06/16/1961 Referring Provider (PT): Saintclair Halsted   Encounter Date: 11/23/2018  PT End of Session - 11/23/18 0841    Visit Number  3    Date for PT Re-Evaluation  01/15/19    PT Start Time  0800    PT Stop Time  0843    PT Time Calculation (min)  43 min    Activity Tolerance  Patient tolerated treatment well    Behavior During Therapy  [facility] for tasks assessed/performed       Past Medical History:  Diagnosis Date  . Arthritis    lower back  . Coronary artery disease   . Family history of adverse reaction to anesthesia    "my mother had a hard waking up from anesthesia"  . H/O echocardiogram 12/04/1997   Normal LV systolic function. No valvular abnormalities.  . H/O myocardial perfusion scan 05/20/2009   The post stress myocardial perfusion images show a normal pattern of perfusion in all regions. the post stress left ventricle is normal in size. no significant wall motion abnormalities noted. Normal myocardial perfusion study. this is a low risk scan.  Marland Kitchen History of kidney stones    "lots"  . Hyperlipidemia   . Hypertension   . Kidney stones   . Lipoma    on back  . Sleep apnea    no CPAP use - denies apnea, waking up gasping/choking, denies daytime sleepiness    Past Surgical History:  Procedure Laterality Date  . BACK SURGERY     Micro surgery at [facility] 2012?  . CARDIAC CATHETERIZATION  > 10 yrs.  . CYSTOSCOPY/RETROGRADE/URETEROSCOPY/STONE EXTRACTION WITH BASKET  1/02, 12/01  . FRACTURE SURGERY  1974   right leg, both humerus, right wrist  . KIDNEY STONE SURGERY  2006  . LIPOMA EXCISION  05/14/2011   Procedure: EXCISION LIPOMA;  Surgeon: Belva Crome, MD;  Location: [place];  Service: General;   Laterality: Right;  EXCISION RIGHT UPPER BACK LIPOMA  . ORIF FINGER / THUMB FRACTURE  2002   right  . SHOULDER ARTHROSCOPY W/ SUBACROMIAL DECOMPRESSION AND DISTAL CLAVICLE EXCISION  7/08   left    There were no vitals filed for this visit.  Subjective Assessment - 11/23/18 0759    Subjective  Pt reports that his legs were sore from last treatment session, more so his L calf, he stated that he had a partial tare in it a year ago, but didn't mentions it because he thought it was healed.     Currently in Pain?  No/denies                       [facility] Adult PT Treatment/Exercise - 11/23/18 0001      Lumbar Exercises: Stretches   Passive Hamstring Stretch  Left;Right;4 reps;10 seconds    Single Knee to Chest Stretch  Right;Left;2 reps;10 seconds    Gastroc Stretch  Right;Left;3 reps;10 seconds      Lumbar Exercises: Aerobic   Recumbent Bike  L1 x4 min     Nustep  level 4 x 6 minutes      Lumbar Exercises: Machines for Strengthening   Other Lumbar Machine Exercise  Rows and lats 25lb 2x15      Lumbar  Exercises: Standing   Row  Theraband;20 reps;Both;Strengthening    Theraband Level (Row)  Level 4 (Blue)    Other Standing Lumbar Exercises  5lb Hip ext/abd x10 each    Other Standing Lumbar Exercises  Straight arm pull downs 25lb 2x15      Lumbar Exercises: Seated   Sit to Stand  10 reps   x2 forward press with yellow ball                  PT Long Term Goals - 11/23/18 0809      PT LONG TERM GOAL #1   Title  return to gym setting safely            Plan - 11/23/18 4492    Clinical Impression Statement  Pt did well with today's interventions, Increase reps tolerated with rows and lats. Some weakness and instability with standing hip exercises. Pt is very tight in the calf and HS. Cues to slow down and control contraction with standing tband rows and extensions.    Stability/Clinical Decision Making  Evolving/Moderate complexity    Rehab Potential   Good    PT Frequency  2x / week    PT Duration  8 weeks    PT Treatment/Interventions  ADLs/Self Care Home Management;Cryotherapy;Electrical Stimulation;Moist Heat;Therapeutic activities;Therapeutic exercise;Neuromuscular re-education;Patient/family education;Manual techniques    PT Next Visit Plan  exercises to help with overall strength and continutioning, flexibility and core stability       Patient will benefit from skilled therapeutic intervention in order to improve the following deficits and impairments:  Abnormal gait, Difficulty walking, Increased muscle spasms, Improper body mechanics, Decreased range of motion, Decreased activity tolerance, Decreased strength, Impaired flexibility, Postural dysfunction, Pain  Visit Diagnosis: Muscle weakness (generalized)  Acute bilateral low back pain with left-sided sciatica     Problem List Patient Active Problem List   Diagnosis Date Noted  . Spinal stenosis at L4-L5 level 08/28/2018  . Lightheadedness 10/20/2017  . Orthostatic lightheadedness 04/22/2017  . Dyspnea on exertion 04/22/2017  . Dizziness 09/05/2013  . Coronary artery disease involving native coronary artery of native heart without angina pectoris 07/18/2013  . Palpitations 05/09/2012  . Atypical chest pain 04/24/2012  . Mixed hyperlipidemia 04/24/2012  . Elevated blood pressure 04/24/2012  . Postop check 05/25/2011  . Rilght upper back lipoma 04/13/2011    Scot Jun, PTA 11/23/2018, 8:45 AM  [place], [place], [address] Phone: [phone]   Fax:  [phone]  Name: DARREL GLOSS MRN: [id number] Date of Birth: 1961/03/06

## 2018-11-28 ENCOUNTER — Encounter: Payer: 59 | Admitting: Physical Therapy

## 2018-11-30 ENCOUNTER — Ambulatory Visit: Payer: 59 | Admitting: Physical Therapy

## 2018-11-30 ENCOUNTER — Encounter: Payer: Self-pay | Admitting: Physical Therapy

## 2018-11-30 ENCOUNTER — Other Ambulatory Visit: Payer: Self-pay

## 2018-11-30 ENCOUNTER — Encounter: Payer: 59 | Admitting: Physical Therapy

## 2018-11-30 DIAGNOSIS — M6281 Muscle weakness (generalized): Secondary | ICD-10-CM

## 2018-11-30 DIAGNOSIS — M5442 Lumbago with sciatica, left side: Secondary | ICD-10-CM

## 2018-11-30 NOTE — Therapy (Signed)
[place], [place], [address] Phone: [phone]   Fax:  [phone]  Physical Therapy Treatment  Patient Details  Name: Steven Henson MRN: [id number] Date of Birth: 1960/09/01 Referring Provider (PT): Saintclair Halsted   Encounter Date: 11/30/2018  PT End of Session - 11/30/18 1427    Visit Number  4    Date for PT Re-Evaluation  01/15/19    PT Start Time  6283    PT Stop Time  1428    PT Time Calculation (min)  43 min    Activity Tolerance  Patient tolerated treatment well    Behavior During Therapy  [facility] for tasks assessed/performed       Past Medical History:  Diagnosis Date  . Arthritis    lower back  . Coronary artery disease   . Family history of adverse reaction to anesthesia    "my mother had a hard waking up from anesthesia"  . H/O echocardiogram 12/04/1997   Normal LV systolic function. No valvular abnormalities.  . H/O myocardial perfusion scan 05/20/2009   The post stress myocardial perfusion images show a normal pattern of perfusion in all regions. the post stress left ventricle is normal in size. no significant wall motion abnormalities noted. Normal myocardial perfusion study. this is a low risk scan.  Marland Kitchen History of kidney stones    "lots"  . Hyperlipidemia   . Hypertension   . Kidney stones   . Lipoma    on back  . Sleep apnea    no CPAP use - denies apnea, waking up gasping/choking, denies daytime sleepiness    Past Surgical History:  Procedure Laterality Date  . BACK SURGERY     Micro surgery at [facility] 2012?  . CARDIAC CATHETERIZATION  > 10 yrs.  . CYSTOSCOPY/RETROGRADE/URETEROSCOPY/STONE EXTRACTION WITH BASKET  1/02, 12/01  . FRACTURE SURGERY  1974   right leg, both humerus, right wrist  . KIDNEY STONE SURGERY  2006  . LIPOMA EXCISION  05/14/2011   Procedure: EXCISION LIPOMA;  Surgeon: Belva Crome, MD;  Location: [place];  Service: General;   Laterality: Right;  EXCISION RIGHT UPPER BACK LIPOMA  . ORIF FINGER / THUMB FRACTURE  2002   right  . SHOULDER ARTHROSCOPY W/ SUBACROMIAL DECOMPRESSION AND DISTAL CLAVICLE EXCISION  7/08   left    There were no vitals filed for this visit.  Subjective Assessment - 11/30/18 1347    Subjective  "Back is a little sore" Pt reports that he may have over did it a little bit this weekend carrying puppy up steps..     Currently in Pain?  No/denies                       [facility] Adult PT Treatment/Exercise - 11/30/18 0001      Lumbar Exercises: Stretches   Passive Hamstring Stretch  Left;Right;4 reps;10 seconds    Single Knee to Chest Stretch  Right;Left;10 seconds;4 reps    Lower Trunk Rotation  20 seconds;2 reps      Lumbar Exercises: Aerobic   Recumbent Bike  L1 x4 min     Nustep  level 4 x 6 minutes      Lumbar Exercises: Machines for Strengthening   Cybex Knee Extension  10# 2x15    Cybex Knee Flexion  25# 2x15    Leg Press  40lb 2x15    Other Lumbar Machine Exercise  Rows and lats 35lb 2x15      Lumbar Exercises: Standing   Other Standing Lumbar Exercises  5lb Hip ext/abd x10 each    Other Standing Lumbar Exercises  [place] pulley ext 15lb 2x10       Lumbar Exercises: Supine   Other Supine Lumbar Exercises  LE on Pball bridges k2c OBlq 2x10                   PT Long Term Goals - 11/30/18 1428      PT LONG TERM GOAL #1   Title  return to gym setting safely    Status  On-going      PT LONG TERM GOAL #2   Status  On-going      PT LONG TERM GOAL #3   Title  increase left hip flexor strength to 4/5    Status  On-going      PT LONG TERM GOAL #4   Title  mow the yard without difficulty    Status  Partially Met            Plan - 11/30/18 1428    Clinical Impression Statement  Pt enters clinic with some low back soreness. This soreness improved some after aerobic warm up. introduced him back to some machine level LE strengthening without  issue. Bilat HS remains tight. He reports obliques felt good with LE on Pball.     Stability/Clinical Decision Making  Evolving/Moderate complexity    Rehab Potential  Good    PT Frequency  2x / week    PT Duration  8 weeks    PT Treatment/Interventions  ADLs/Self Care Home Management;Cryotherapy;Electrical Stimulation;Moist Heat;Therapeutic activities;Therapeutic exercise;Neuromuscular re-education;Patient/family education;Manual techniques    PT Next Visit Plan  exercises to help with overall strength and continutioning, flexibility and core stability       Patient will benefit from skilled therapeutic intervention in order to improve the following deficits and impairments:  Abnormal gait, Difficulty walking, Increased muscle spasms, Improper body mechanics, Decreased range of motion, Decreased activity tolerance, Decreased strength, Impaired flexibility, Postural dysfunction, Pain  Visit Diagnosis: Acute bilateral low back pain with left-sided sciatica  Muscle weakness (generalized)     Problem List Patient Active Problem List   Diagnosis Date Noted  . Spinal stenosis at L4-L5 level 08/28/2018  . Lightheadedness 10/20/2017  . Orthostatic lightheadedness 04/22/2017  . Dyspnea on exertion 04/22/2017  . Dizziness 09/05/2013  . Coronary artery disease involving native coronary artery of native heart without angina pectoris 07/18/2013  . Palpitations 05/09/2012  . Atypical chest pain 04/24/2012  . Mixed hyperlipidemia 04/24/2012  . Elevated blood pressure 04/24/2012  . Postop check 05/25/2011  . Rilght upper back lipoma 04/13/2011    Scot Jun, PTA 11/30/2018, 2:31 PM  [place], [place], [address] Phone: [phone]   Fax:  [phone]  Name: Steven Henson MRN: [id number] Date of Birth: 09-03-1960

## 2018-12-04 ENCOUNTER — Ambulatory Visit: Payer: 59 | Attending: Neurosurgery | Admitting: Physical Therapy

## 2018-12-04 ENCOUNTER — Encounter: Payer: Self-pay | Admitting: Physical Therapy

## 2018-12-04 ENCOUNTER — Other Ambulatory Visit: Payer: Self-pay

## 2018-12-04 DIAGNOSIS — M6281 Muscle weakness (generalized): Secondary | ICD-10-CM

## 2018-12-04 DIAGNOSIS — M5442 Lumbago with sciatica, left side: Secondary | ICD-10-CM

## 2018-12-04 NOTE — Therapy (Signed)
[place], [place], [address] Phone: [phone]   Fax:  [phone]  Physical Therapy Treatment  Patient Details  Name: Steven Henson MRN: [id number] Date of Birth: May 24, 1961 Referring Provider (PT): Saintclair Halsted   Encounter Date: 12/04/2018  PT End of Session - 12/04/18 0842    Visit Number  5    Date for PT Re-Evaluation  01/15/19    PT Start Time  0800    PT Stop Time  0842    PT Time Calculation (min)  42 min    Activity Tolerance  Patient tolerated treatment well    Behavior During Therapy  [facility] for tasks assessed/performed       Past Medical History:  Diagnosis Date  . Arthritis    lower back  . Coronary artery disease   . Family history of adverse reaction to anesthesia    "my mother had a hard waking up from anesthesia"  . H/O echocardiogram 12/04/1997   Normal LV systolic function. No valvular abnormalities.  . H/O myocardial perfusion scan 05/20/2009   The post stress myocardial perfusion images show a normal pattern of perfusion in all regions. the post stress left ventricle is normal in size. no significant wall motion abnormalities noted. Normal myocardial perfusion study. this is a low risk scan.  Marland Kitchen History of kidney stones    "lots"  . Hyperlipidemia   . Hypertension   . Kidney stones   . Lipoma    on back  . Sleep apnea    no CPAP use - denies apnea, waking up gasping/choking, denies daytime sleepiness    Past Surgical History:  Procedure Laterality Date  . BACK SURGERY     Micro surgery at [facility] 2012?  . CARDIAC CATHETERIZATION  > 10 yrs.  . CYSTOSCOPY/RETROGRADE/URETEROSCOPY/STONE EXTRACTION WITH BASKET  1/02, 12/01  . FRACTURE SURGERY  1974   right leg, both humerus, right wrist  . KIDNEY STONE SURGERY  2006  . LIPOMA EXCISION  05/14/2011   Procedure: EXCISION LIPOMA;  Surgeon: Belva Crome, MD;  Location: [place];  Service: General;   Laterality: Right;  EXCISION RIGHT UPPER BACK LIPOMA  . ORIF FINGER / THUMB FRACTURE  2002   right  . SHOULDER ARTHROSCOPY W/ SUBACROMIAL DECOMPRESSION AND DISTAL CLAVICLE EXCISION  7/08   left    There were no vitals filed for this visit.  Subjective Assessment - 12/04/18 0756    Subjective  "I am feeling pretty good today""    Limitations  Walking;House hold activities;Lifting;Standing    Patient Stated Goals  get stronger, feel better    Currently in Pain?  No/denies    Pain Score  0-No pain         [facility] PT Assessment - 12/04/18 0001      AROM   Overall AROM Comments  Lumbar ROM WFL       Strength   Left Hip Flexion  4-/5    Left Hip ABduction  4/5    Left Hip ADduction  5/5    Left Knee Flexion  4-/5    Left Knee Extension  5/5                   [facility] Adult PT Treatment/Exercise - 12/04/18 0001      Lumbar Exercises: Aerobic   Elliptical  I7 R5 x2 min each wy    Nustep  level 5 x 6 minutes  Lumbar Exercises: Machines for Strengthening   Cybex Knee Extension  15# 3x10    Cybex Knee Flexion  35# 3x10    Leg Press  50lb 3x10    Other Lumbar Machine Exercise  Rows and lats 35lb 2x15      Lumbar Exercises: Standing   Other Standing Lumbar Exercises  10lb Hip ext/abd x10, 5lb x15 each     Other Standing Lumbar Exercises  cross body chops, overhead ext with yellow ball x 10 each; Shoulder Ext 15lb 3x10                   PT Long Term Goals - 12/04/18 0814      PT LONG TERM GOAL #3   Title  increase left hip flexor strength to 4/5    Status  Partially Met            Plan - 12/04/18 0842    Clinical Impression Statement  Pt has progressed towards all goals. He has increase his lumbar ROM. Overall strength has improved but some weakness remain in L hip flexor. Some weakness exposed with standing hip exercises. He did well with increase weight on leg press and extensions.     Rehab Potential  Good    PT Frequency  2x / week    PT  Duration  8 weeks    PT Next Visit Plan  exercises to help with overall strength and continutioning, flexibility and core stability       Patient will benefit from skilled therapeutic intervention in order to improve the following deficits and impairments:  Abnormal gait, Difficulty walking, Increased muscle spasms, Improper body mechanics, Decreased range of motion, Decreased activity tolerance, Decreased strength, Impaired flexibility, Postural dysfunction, Pain  Visit Diagnosis: Muscle weakness (generalized)  Acute bilateral low back pain with left-sided sciatica     Problem List Patient Active Problem List   Diagnosis Date Noted  . Spinal stenosis at L4-L5 level 08/28/2018  . Lightheadedness 10/20/2017  . Orthostatic lightheadedness 04/22/2017  . Dyspnea on exertion 04/22/2017  . Dizziness 09/05/2013  . Coronary artery disease involving native coronary artery of native heart without angina pectoris 07/18/2013  . Palpitations 05/09/2012  . Atypical chest pain 04/24/2012  . Mixed hyperlipidemia 04/24/2012  . Elevated blood pressure 04/24/2012  . Postop check 05/25/2011  . Rilght upper back lipoma 04/13/2011    Scot Jun, PTA 12/04/2018, 8:49 AM  [place], [place], [address] Phone: [phone]   Fax:  [phone]  Name: Steven Henson MRN: [id number] Date of Birth: [birth year]

## 2018-12-07 ENCOUNTER — Other Ambulatory Visit: Payer: Self-pay

## 2018-12-07 ENCOUNTER — Ambulatory Visit: Payer: 59 | Admitting: Physical Therapy

## 2018-12-07 ENCOUNTER — Encounter: Payer: Self-pay | Admitting: Physical Therapy

## 2018-12-07 DIAGNOSIS — M5442 Lumbago with sciatica, left side: Secondary | ICD-10-CM

## 2018-12-07 DIAGNOSIS — M6281 Muscle weakness (generalized): Secondary | ICD-10-CM | POA: Diagnosis not present

## 2018-12-07 NOTE — Therapy (Signed)
[place], [place], [address] Phone: [phone]   Fax:  [phone]  Physical Therapy Treatment  Patient Details  Name: Steven Henson MRN: [id number] Date of Birth: 06-21-1961 Referring Provider (PT): Saintclair Halsted   Encounter Date: 12/07/2018  PT End of Session - 12/07/18 1008    Visit Number  6    Date for PT Re-Evaluation  01/15/19    PT Start Time  0930    PT Stop Time  1011    PT Time Calculation (min)  41 min    Activity Tolerance  Patient tolerated treatment well    Behavior During Therapy  [facility] for tasks assessed/performed       Past Medical History:  Diagnosis Date  . Arthritis    lower back  . Coronary artery disease   . Family history of adverse reaction to anesthesia    "my mother had a hard waking up from anesthesia"  . H/O echocardiogram 12/04/1997   Normal LV systolic function. No valvular abnormalities.  . H/O myocardial perfusion scan 05/20/2009   The post stress myocardial perfusion images show a normal pattern of perfusion in all regions. the post stress left ventricle is normal in size. no significant wall motion abnormalities noted. Normal myocardial perfusion study. this is a low risk scan.  Marland Kitchen History of kidney stones    "lots"  . Hyperlipidemia   . Hypertension   . Kidney stones   . Lipoma    on back  . Sleep apnea    no CPAP use - denies apnea, waking up gasping/choking, denies daytime sleepiness    Past Surgical History:  Procedure Laterality Date  . BACK SURGERY     Micro surgery at [facility] 2012?  . CARDIAC CATHETERIZATION  > 10 yrs.  . CYSTOSCOPY/RETROGRADE/URETEROSCOPY/STONE EXTRACTION WITH BASKET  1/02, 12/01  . FRACTURE SURGERY  1974   right leg, both humerus, right wrist  . KIDNEY STONE SURGERY  2006  . LIPOMA EXCISION  05/14/2011   Procedure: EXCISION LIPOMA;  Surgeon: Belva Crome, MD;  Location: [place];  Service: General;   Laterality: Right;  EXCISION RIGHT UPPER BACK LIPOMA  . ORIF FINGER / THUMB FRACTURE  2002   right  . SHOULDER ARTHROSCOPY W/ SUBACROMIAL DECOMPRESSION AND DISTAL CLAVICLE EXCISION  7/08   left    There were no vitals filed for this visit.  Subjective Assessment - 12/07/18 0933    Subjective  "Pretty good" Pt reports a strain in the left HS    Currently in Pain?  No/denies    Pain Location  Leg    Pain Orientation  Left;Posterior;Proximal    Pain Descriptors / Indicators  Sore                       [facility] Adult PT Treatment/Exercise - 12/07/18 0001      Lumbar Exercises: Stretches   Passive Hamstring Stretch  Left;Right;4 reps;10 seconds    Single Knee to Chest Stretch  Right;Left;10 seconds;4 reps    Gastroc Stretch  Right;Left;3 reps;10 seconds      Lumbar Exercises: Aerobic   Elliptical  I8 R4 x2 min each wy    Nustep  level 5 x 6 minutes      Lumbar Exercises: Machines for Strengthening   Cybex Knee Extension  15# 3x10    Leg Press  50lb 3x10    Other Lumbar Machine Exercise  Rows and lats 35lb 2x15      Lumbar Exercises: Standing   Other Standing Lumbar Exercises  AR press 15lb x10 each, Shoulder Ext pulley15lb 2x10; Resisted side step 40lb x 5 each                  PT Long Term Goals - 12/04/18 3582      PT LONG TERM GOAL #3   Title  increase left hip flexor strength to 4/5    Status  Partially Met            Plan - 12/07/18 1008    Clinical Impression Statement  Pt enters clinic reporting some HS soreness, avoid interventions that directly stressed the HS. No reports of increase pain with today's exercises. Increase reps tolerated with seated rows and lats. Bilat HS tightness remain noted with passive stretching.    Stability/Clinical Decision Making  Evolving/Moderate complexity    Rehab Potential  Good    PT Frequency  2x / week    PT Duration  8 weeks    PT Treatment/Interventions  ADLs/Self Care Home  Management;Cryotherapy;Electrical Stimulation;Moist Heat;Therapeutic activities;Therapeutic exercise;Neuromuscular re-education;Patient/family education;Manual techniques    PT Next Visit Plan  exercises to help with overall strength and continutioning, flexibility and core stability       Patient will benefit from skilled therapeutic intervention in order to improve the following deficits and impairments:  Abnormal gait, Difficulty walking, Increased muscle spasms, Improper body mechanics, Decreased range of motion, Decreased activity tolerance, Decreased strength, Impaired flexibility, Postural dysfunction, Pain  Visit Diagnosis: Muscle weakness (generalized)  Acute bilateral low back pain with left-sided sciatica     Problem List Patient Active Problem List   Diagnosis Date Noted  . Spinal stenosis at L4-L5 level 08/28/2018  . Lightheadedness 10/20/2017  . Orthostatic lightheadedness 04/22/2017  . Dyspnea on exertion 04/22/2017  . Dizziness 09/05/2013  . Coronary artery disease involving native coronary artery of native heart without angina pectoris 07/18/2013  . Palpitations 05/09/2012  . Atypical chest pain 04/24/2012  . Mixed hyperlipidemia 04/24/2012  . Elevated blood pressure 04/24/2012  . Postop check 05/25/2011  . Rilght upper back lipoma 04/13/2011    Scot Jun, PTA 12/07/2018, 10:10 AM  [place], [place], [address] Phone: [phone]   Fax:  [phone]  Name: Steven Henson MRN: [id number] Date of Birth: [birth year]

## 2018-12-11 ENCOUNTER — Encounter: Payer: Self-pay | Admitting: Physical Therapy

## 2018-12-11 ENCOUNTER — Ambulatory Visit: Payer: 59 | Admitting: Physical Therapy

## 2018-12-11 ENCOUNTER — Other Ambulatory Visit: Payer: Self-pay

## 2018-12-11 DIAGNOSIS — M6281 Muscle weakness (generalized): Secondary | ICD-10-CM

## 2018-12-11 DIAGNOSIS — M5442 Lumbago with sciatica, left side: Secondary | ICD-10-CM

## 2018-12-11 NOTE — Therapy (Signed)
[place], [place], [address] Phone: [phone]   Fax:  [phone]  Physical Therapy Treatment  Patient Details  Name: Steven Henson MRN: [id number] Date of Birth: 03/19/1961 Referring Provider (PT): Steven Henson   Encounter Date: 12/11/2018  PT End of Session - 12/11/18 0950    Visit Number  7    Date for PT Re-Evaluation  01/15/19    PT Start Time  0855    PT Stop Time  0945    PT Time Calculation (min)  50 min    Activity Tolerance  Patient tolerated treatment well    Behavior During Therapy  [facility] for tasks assessed/performed       Past Medical History:  Diagnosis Date  . Arthritis    lower back  . Coronary artery disease   . Family history of adverse reaction to anesthesia    "my mother had a hard waking up from anesthesia"  . H/O echocardiogram 12/04/1997   Normal LV systolic function. No valvular abnormalities.  . H/O myocardial perfusion scan 05/20/2009   The post stress myocardial perfusion images show a normal pattern of perfusion in all regions. the post stress left ventricle is normal in size. no significant wall motion abnormalities noted. Normal myocardial perfusion study. this is a low risk scan.  Marland Kitchen History of kidney stones    "lots"  . Hyperlipidemia   . Hypertension   . Kidney stones   . Lipoma    on back  . Sleep apnea    no CPAP use - denies apnea, waking up gasping/choking, denies daytime sleepiness    Past Surgical History:  Procedure Laterality Date  . BACK SURGERY     Micro surgery at [facility] 2012?  . CARDIAC CATHETERIZATION  > 10 yrs.  . CYSTOSCOPY/RETROGRADE/URETEROSCOPY/STONE EXTRACTION WITH BASKET  1/02, 12/01  . FRACTURE SURGERY  1974   right leg, both humerus, right wrist  . KIDNEY STONE SURGERY  2006  . LIPOMA EXCISION  05/14/2011   Procedure: EXCISION LIPOMA;  Surgeon: Belva Crome, MD;  Location: [place];  Service: General;   Laterality: Right;  EXCISION RIGHT UPPER BACK LIPOMA  . ORIF FINGER / THUMB FRACTURE  2002   right  . SHOULDER ARTHROSCOPY W/ SUBACROMIAL DECOMPRESSION AND DISTAL CLAVICLE EXCISION  7/08   left    There were no vitals filed for this visit.  Subjective Assessment - 12/11/18 0857    Subjective  "I feel good, back is a little sore but no more than I would expect from getting back in shape" HS is still sore but is getting better    Currently in Pain?  No/denies    Pain Location  Back    Pain Orientation  Left    Pain Descriptors / Indicators  Sore         [facility] PT Assessment - 12/11/18 0001      AROM   Overall AROM Comments  Lumbar ROM WFL                    [facility] Adult PT Treatment/Exercise - 12/11/18 0001      Lumbar Exercises: Stretches   Passive Hamstring Stretch  Left;Right;4 reps;10 seconds    Single Knee to Chest Stretch  Right;Left;10 seconds;4 reps    Gastroc Stretch  Right;Left;3 reps;10 seconds      Lumbar Exercises: Aerobic   Elliptical  I8 R4 x2 min each wy  Nustep  level 5 x 6 minutes      Lumbar Exercises: Machines for Strengthening   Cybex Lumbar Extension  balck band 2x15     Cybex Knee Extension  20# 3x10    Leg Press  60lb 2x15    Other Lumbar Machine Exercise  Rows and lats 35lb 2x15      Lumbar Exercises: Standing   Other Standing Lumbar Exercises  10lb Hip ext/abd 5lb x15 each; Standing overhead ext 2x15    Other Standing Lumbar Exercises  AR press 20lb x10 each, Shoulder Ext pulley15lb 2x10                  PT Long Term Goals - 12/04/18 6191      PT LONG TERM GOAL #3   Title  increase left hip flexor strength to 4/5    Status  Partially Met            Plan - 12/11/18 0952    Clinical Impression Statement  HS soreness has improved but tightness in both remains. He continues to [employer] strength with all machine level exercises. Some tightness noted with overhead extensions. Core weakness noted with anti  rotational presses.     Stability/Clinical Decision Making  Evolving/Moderate complexity    PT Treatment/Interventions  ADLs/Self Care Home Management;Cryotherapy;Electrical Stimulation;Moist Heat;Therapeutic activities;Therapeutic exercise;Neuromuscular re-education;Patient/family education;Manual techniques    PT Next Visit Plan  exercises to help with overall strength and continutioning, flexibility and core stability       Patient will benefit from skilled therapeutic intervention in order to improve the following deficits and impairments:  Abnormal gait, Difficulty walking, Increased muscle spasms, Improper body mechanics, Decreased range of motion, Decreased activity tolerance, Decreased strength, Impaired flexibility, Postural dysfunction, Pain  Visit Diagnosis: Acute bilateral low back pain with left-sided sciatica  Muscle weakness (generalized)     Problem List Patient Active Problem List   Diagnosis Date Noted  . Spinal stenosis at L4-L5 level 08/28/2018  . Lightheadedness 10/20/2017  . Orthostatic lightheadedness 04/22/2017  . Dyspnea on exertion 04/22/2017  . Dizziness 09/05/2013  . Coronary artery disease involving native coronary artery of native heart without angina pectoris 07/18/2013  . Palpitations 05/09/2012  . Atypical chest pain 04/24/2012  . Mixed hyperlipidemia 04/24/2012  . Elevated blood pressure 04/24/2012  . Postop check 05/25/2011  . Rilght upper back lipoma 04/13/2011    Scot Jun, PTA 12/11/2018, 9:54 AM  [place], [place], [address] Phone: [phone]   Fax:  [phone]  Name: Steven Henson MRN: [id number] Date of Birth: [birth year]

## 2018-12-14 ENCOUNTER — Other Ambulatory Visit: Payer: Self-pay

## 2018-12-14 ENCOUNTER — Encounter: Payer: Self-pay | Admitting: Physical Therapy

## 2018-12-14 ENCOUNTER — Ambulatory Visit: Payer: 59 | Admitting: Physical Therapy

## 2018-12-14 DIAGNOSIS — M6281 Muscle weakness (generalized): Secondary | ICD-10-CM | POA: Diagnosis not present

## 2018-12-14 DIAGNOSIS — M5442 Lumbago with sciatica, left side: Secondary | ICD-10-CM

## 2018-12-14 NOTE — Therapy (Signed)
[place], [place], [address] Phone: [phone]   Fax:  [phone]  Physical Therapy Treatment  Patient Details  Name: Steven Henson MRN: [id number] Date of Birth: [birth year] Referring Provider (PT): Saintclair Halsted   Encounter Date: 12/14/2018  PT End of Session - 12/14/18 1145    Visit Number  8    Date for PT Re-Evaluation  01/15/19    PT Start Time  1100    PT Stop Time  1145    PT Time Calculation (min)  45 min    Activity Tolerance  Patient tolerated treatment well    Behavior During Therapy  [facility] for tasks assessed/performed       Past Medical History:  Diagnosis Date  . Arthritis    lower back  . Coronary artery disease   . Family history of adverse reaction to anesthesia    "my mother had a hard waking up from anesthesia"  . H/O echocardiogram 12/04/1997   Normal LV systolic function. No valvular abnormalities.  . H/O myocardial perfusion scan 05/20/2009   The post stress myocardial perfusion images show a normal pattern of perfusion in all regions. the post stress left ventricle is normal in size. no significant wall motion abnormalities noted. Normal myocardial perfusion study. this is a low risk scan.  Marland Kitchen History of kidney stones    "lots"  . Hyperlipidemia   . Hypertension   . Kidney stones   . Lipoma    on back  . Sleep apnea    no CPAP use - denies apnea, waking up gasping/choking, denies daytime sleepiness    Past Surgical History:  Procedure Laterality Date  . BACK SURGERY     Micro surgery at [facility] 2012?  . CARDIAC CATHETERIZATION  > 10 yrs.  . CYSTOSCOPY/RETROGRADE/URETEROSCOPY/STONE EXTRACTION WITH BASKET  1/02, 12/01  . FRACTURE SURGERY  1974   right leg, both humerus, right wrist  . KIDNEY STONE SURGERY  2006  . LIPOMA EXCISION  05/14/2011   Procedure: EXCISION LIPOMA;  Surgeon: Belva Crome, MD;  Location: [place];  Service: General;   Laterality: Right;  EXCISION RIGHT UPPER BACK LIPOMA  . ORIF FINGER / THUMB FRACTURE  2002   right  . SHOULDER ARTHROSCOPY W/ SUBACROMIAL DECOMPRESSION AND DISTAL CLAVICLE EXCISION  7/08   left    There were no vitals filed for this visit.  Subjective Assessment - 12/14/18 1104    Subjective  "My leg don't hurt, my back is sore though"    Currently in Pain?  Yes    Pain Score  2     Pain Location  Back                       [facility] Adult PT Treatment/Exercise - 12/14/18 0001      Lumbar Exercises: Stretches   Passive Hamstring Stretch  Left;Right;4 reps;10 seconds    Single Knee to Chest Stretch  Right;Left;10 seconds;4 reps    Gastroc Stretch  Right;Left;3 reps;10 seconds      Lumbar Exercises: Aerobic   Nustep  level 4 x 6 minutes      Lumbar Exercises: Machines for Strengthening   Cybex Lumbar Extension  balck band 2x15     Cybex Knee Extension  20# 3x10    Cybex Knee Flexion  25# 2x15    Leg Press  30lb SL 2x10 each     Other Lumbar  Machine Exercise  Rows and lats 45lb 2x10      Lumbar Exercises: Standing   Other Standing Lumbar Exercises  10lb Hip ext/abd 10lb 2x10 each; Standing overhead ext 2x10    Other Standing Lumbar Exercises  AR press 20lb x10 each, Shoulder Ext pulley15lb 2x15                  PT Long Term Goals - 12/14/18 1144      PT LONG TERM GOAL #2   Title  return to pickelball without pain    Status  On-going      PT LONG TERM GOAL #3   Status  Partially Met            Plan - 12/14/18 1145    Clinical Impression Statement  Pt is progressing towards all goals. He report no functional limitations at home. He is unable to go to the gym due to covid 19. Added back HS strengthening at lighter weight. He did well with SL strengthening. Bilat HS remains tighten but piriformis flexibility is good.    Stability/Clinical Decision Making  Evolving/Moderate complexity    PT Frequency  2x / week    PT Duration  8 weeks    PT  Treatment/Interventions  ADLs/Self Care Home Management;Cryotherapy;Electrical Stimulation;Moist Heat;Therapeutic activities;Therapeutic exercise;Neuromuscular re-education;Patient/family education;Manual techniques    PT Next Visit Plan  exercises to help with overall strength and continutioning, flexibility and core stability       Patient will benefit from skilled therapeutic intervention in order to improve the following deficits and impairments:  Abnormal gait, Difficulty walking, Increased muscle spasms, Improper body mechanics, Decreased range of motion, Decreased activity tolerance, Decreased strength, Impaired flexibility, Postural dysfunction, Pain  Visit Diagnosis: Muscle weakness (generalized)  Acute bilateral low back pain with left-sided sciatica     Problem List Patient Active Problem List   Diagnosis Date Noted  . Spinal stenosis at L4-L5 level 08/28/2018  . Lightheadedness 10/20/2017  . Orthostatic lightheadedness 04/22/2017  . Dyspnea on exertion 04/22/2017  . Dizziness 09/05/2013  . Coronary artery disease involving native coronary artery of native heart without angina pectoris 07/18/2013  . Palpitations 05/09/2012  . Atypical chest pain 04/24/2012  . Mixed hyperlipidemia 04/24/2012  . Elevated blood pressure 04/24/2012  . Postop check 05/25/2011  . Rilght upper back lipoma 04/13/2011    Scot Jun, PTA 12/14/2018, 11:47 AM  [place], [place], [address] Phone: [phone]   Fax:  [phone]  Name: Steven Henson MRN: [id number] Date of Birth: [birth year]

## 2018-12-21 ENCOUNTER — Ambulatory Visit: Payer: 59 | Admitting: Physical Therapy

## 2018-12-21 ENCOUNTER — Encounter: Payer: Self-pay | Admitting: Physical Therapy

## 2018-12-21 ENCOUNTER — Other Ambulatory Visit: Payer: Self-pay

## 2018-12-21 DIAGNOSIS — M6281 Muscle weakness (generalized): Secondary | ICD-10-CM

## 2018-12-21 DIAGNOSIS — M5442 Lumbago with sciatica, left side: Secondary | ICD-10-CM

## 2018-12-21 NOTE — Therapy (Signed)
[place], [place], [address] Phone: [phone]   Fax:  [phone]  Physical Therapy Treatment  Patient Details  Name: Steven Henson MRN: [id number] Date of Birth: 01/23/1961 Referring Provider (PT): Saintclair Halsted   Encounter Date: 12/21/2018  PT End of Session - 12/21/18 1522    Visit Number  9    Date for PT Re-Evaluation  01/15/19    PT Start Time  8099    PT Stop Time  1518    PT Time Calculation (min)  20 min    Activity Tolerance  Patient tolerated treatment well    Behavior During Therapy  [facility] for tasks assessed/performed       Past Medical History:  Diagnosis Date  . Arthritis    lower back  . Coronary artery disease   . Family history of adverse reaction to anesthesia    "my mother had a hard waking up from anesthesia"  . H/O echocardiogram 12/04/1997   Normal LV systolic function. No valvular abnormalities.  . H/O myocardial perfusion scan 05/20/2009   The post stress myocardial perfusion images show a normal pattern of perfusion in all regions. the post stress left ventricle is normal in size. no significant wall motion abnormalities noted. Normal myocardial perfusion study. this is a low risk scan.  Steven Henson History of kidney stones    "lots"  . Hyperlipidemia   . Hypertension   . Kidney stones   . Lipoma    on back  . Sleep apnea    no CPAP use - denies apnea, waking up gasping/choking, denies daytime sleepiness    Past Surgical History:  Procedure Laterality Date  . BACK SURGERY     Micro surgery at [facility] 2012?  . CARDIAC CATHETERIZATION  > 10 yrs.  . CYSTOSCOPY/RETROGRADE/URETEROSCOPY/STONE EXTRACTION WITH BASKET  1/02, 12/01  . FRACTURE SURGERY  1974   right leg, both humerus, right wrist  . KIDNEY STONE SURGERY  2006  . LIPOMA EXCISION  05/14/2011   Procedure: EXCISION LIPOMA;  Surgeon: Belva Crome, MD;  Location: [place];  Service: General;   Laterality: Right;  EXCISION RIGHT UPPER BACK LIPOMA  . ORIF FINGER / THUMB FRACTURE  2002   right  . SHOULDER ARTHROSCOPY W/ SUBACROMIAL DECOMPRESSION AND DISTAL CLAVICLE EXCISION  7/08   left    There were no vitals filed for this visit.  Subjective Assessment - 12/21/18 1459    Subjective  Pt stated that he needs a good stretch today, reports hitting the pickle ball around yesterday and is having some soreness in his back    Currently in Pain?  No/denies    Pain Score  0-No pain    Pain Location  Back    Pain Descriptors / Indicators  Sore                       [facility] Adult PT Treatment/Exercise - 12/21/18 0001      Lumbar Exercises: Stretches   Passive Hamstring Stretch  Left;Right;4 reps;20 seconds    Single Knee to Chest Stretch  Left;Right;3 reps;20 seconds    Double Knee to Chest Stretch  4 reps;20 seconds   with some rotation   Gastroc Stretch  Right;Left;3 reps;20 seconds      Lumbar Exercises: Aerobic   Nustep  level 4 x 7 minutes  PT Long Term Goals - 12/14/18 1144      PT LONG TERM GOAL #2   Title  return to pickelball without pain    Status  On-going      PT LONG TERM GOAL #3   Status  Partially Met            Plan - 12/21/18 1523    Clinical Impression Statement  Pt elected to warm up and passive stretching due to his soreness from hitting pickle ball yesterday. Bilat HS tightness remains, positive response from single K2C single leg and bilaterally.    Stability/Clinical Decision Making  Evolving/Moderate complexity    Rehab Potential  Good    PT Frequency  2x / week    PT Treatment/Interventions  ADLs/Self Care Home Management;Cryotherapy;Electrical Stimulation;Moist Heat;Therapeutic activities;Therapeutic exercise;Neuromuscular re-education;Patient/family education;Manual techniques    PT Next Visit Plan  exercises to help with overall strength and continutioning, flexibility and core stability        Patient will benefit from skilled therapeutic intervention in order to improve the following deficits and impairments:  Abnormal gait, Difficulty walking, Increased muscle spasms, Improper body mechanics, Decreased range of motion, Decreased activity tolerance, Decreased strength, Impaired flexibility, Postural dysfunction, Pain  Visit Diagnosis: 1. Acute bilateral low back pain with left-sided sciatica   2. Muscle weakness (generalized)        Problem List Patient Active Problem List   Diagnosis Date Noted  . Spinal stenosis at L4-L5 level 08/28/2018  . Lightheadedness 10/20/2017  . Orthostatic lightheadedness 04/22/2017  . Dyspnea on exertion 04/22/2017  . Dizziness 09/05/2013  . Coronary artery disease involving native coronary artery of native heart without angina pectoris 07/18/2013  . Palpitations 05/09/2012  . Atypical chest pain 04/24/2012  . Mixed hyperlipidemia 04/24/2012  . Elevated blood pressure 04/24/2012  . Postop check 05/25/2011  . Rilght upper back lipoma 04/13/2011    Scot Jun, PTA 12/21/2018, 3:25 PM  [place], [place], [address] Phone: [phone]   Fax:  [phone]  Name: Steven Henson MRN: [id number] Date of Birth: 08/25/1960

## 2018-12-25 ENCOUNTER — Encounter: Payer: Self-pay | Admitting: Physical Therapy

## 2018-12-25 ENCOUNTER — Ambulatory Visit: Payer: 59 | Admitting: Physical Therapy

## 2018-12-25 ENCOUNTER — Other Ambulatory Visit: Payer: Self-pay

## 2018-12-25 DIAGNOSIS — M5442 Lumbago with sciatica, left side: Secondary | ICD-10-CM

## 2018-12-25 DIAGNOSIS — M6281 Muscle weakness (generalized): Secondary | ICD-10-CM | POA: Diagnosis not present

## 2018-12-25 NOTE — Therapy (Signed)
[place], [place], [address] Phone: [phone]   Fax:  [phone]  Physical Therapy Treatment  Patient Details  Name: Steven Henson MRN: [id number] Date of Birth: 22-Nov-1960 Referring Provider (PT): Saintclair Halsted   Encounter Date: 12/25/2018  PT End of Session - 12/25/18 0940    Visit Number  10    Date for PT Re-Evaluation  01/15/19    PT Start Time  0856    PT Stop Time  0940    PT Time Calculation (min)  44 min    Activity Tolerance  Patient tolerated treatment well    Behavior During Therapy  [facility] for tasks assessed/performed       Past Medical History:  Diagnosis Date  . Arthritis    lower back  . Coronary artery disease   . Family history of adverse reaction to anesthesia    "my mother had a hard waking up from anesthesia"  . H/O echocardiogram 12/04/1997   Normal LV systolic function. No valvular abnormalities.  . H/O myocardial perfusion scan 05/20/2009   The post stress myocardial perfusion images show a normal pattern of perfusion in all regions. the post stress left ventricle is normal in size. no significant wall motion abnormalities noted. Normal myocardial perfusion study. this is a low risk scan.  Marland Kitchen History of kidney stones    "lots"  . Hyperlipidemia   . Hypertension   . Kidney stones   . Lipoma    on back  . Sleep apnea    no CPAP use - denies apnea, waking up gasping/choking, denies daytime sleepiness    Past Surgical History:  Procedure Laterality Date  . BACK SURGERY     Micro surgery at [facility] 2012?  . CARDIAC CATHETERIZATION  > 10 yrs.  . CYSTOSCOPY/RETROGRADE/URETEROSCOPY/STONE EXTRACTION WITH BASKET  1/02, 12/01  . FRACTURE SURGERY  1974   right leg, both humerus, right wrist  . KIDNEY STONE SURGERY  2006  . LIPOMA EXCISION  05/14/2011   Procedure: EXCISION LIPOMA;  Surgeon: Belva Crome, MD;  Location: [place];  Service: General;   Laterality: Right;  EXCISION RIGHT UPPER BACK LIPOMA  . ORIF FINGER / THUMB FRACTURE  2002   right  . SHOULDER ARTHROSCOPY W/ SUBACROMIAL DECOMPRESSION AND DISTAL CLAVICLE EXCISION  7/08   left    There were no vitals filed for this visit.  Subjective Assessment - 12/25/18 0854    Subjective  Pt reports that he is feeling pretty good, still have some soreness from hitting pickle ball    Currently in Pain?  No/denies                       [facility] Adult PT Treatment/Exercise - 12/25/18 0001      Lumbar Exercises: Stretches   Passive Hamstring Stretch  Left;Right;4 reps;20 seconds    Single Knee to Chest Stretch  Left;Right;3 reps;20 seconds    Gastroc Stretch  Right;Left;3 reps;20 seconds      Lumbar Exercises: Aerobic   Nustep  level 5 x 7 minutes      Lumbar Exercises: Machines for Strengthening   Cybex Lumbar Extension  balck band 2x15     Leg Press  60lb 2x15, SL 30lb x15 each     Other Lumbar Machine Exercise  Rows and lats 45lb 2x15; chest press 25lb 2x15      Lumbar Exercises: Standing   Other Standing  Lumbar Exercises  10lb Hip ext/abd 10lb 2x10 each; Standing overhead ext 2x10 blue ball       Lumbar Exercises: Seated   Sit to Stand  10 reps   X2, OHP at top with blue ball    Other Seated Lumbar Exercises  Bent over rows, ext, and rev flys 6lb x10                   PT Long Term Goals - 12/25/18 0943      PT LONG TERM GOAL #1   Title  return to gym setting safely    Status  On-going      PT LONG TERM GOAL #2   Title  return to pickelball without pain    Status  Partially Met      PT LONG TERM GOAL #3   Title  increase left hip flexor strength to 4/5    Status  Partially Met      PT LONG TERM GOAL #4   Title  mow the yard without difficulty    Status  Achieved            Plan - 12/25/18 0944    Clinical Impression Statement  Pt reports some remaining soreness form hitting the pickle ball last week. He continues to progress  well. Increase resistance tolerated on leg press, seated rows, and seated lats without issue. Progressed to chest press and he tolerated it well. Some fatigue noted with sit to stands. Tightnes nooted in LE's L>R    Stability/Clinical Decision Making  Evolving/Moderate complexity    Rehab Potential  Good    PT Frequency  2x / week    PT Duration  8 weeks    PT Treatment/Interventions  ADLs/Self Care Home Management;Cryotherapy;Electrical Stimulation;Moist Heat;Therapeutic activities;Therapeutic exercise;Neuromuscular re-education;Patient/family education;Manual techniques    PT Next Visit Plan  exercises to help with overall strength and continutioning, flexibility and core stability       Patient will benefit from skilled therapeutic intervention in order to improve the following deficits and impairments:  Abnormal gait, Difficulty walking, Increased muscle spasms, Improper body mechanics, Decreased range of motion, Decreased activity tolerance, Decreased strength, Impaired flexibility, Postural dysfunction, Pain  Visit Diagnosis: 1. Acute bilateral low back pain with left-sided sciatica        Problem List Patient Active Problem List   Diagnosis Date Noted  . Spinal stenosis at L4-L5 level 08/28/2018  . Lightheadedness 10/20/2017  . Orthostatic lightheadedness 04/22/2017  . Dyspnea on exertion 04/22/2017  . Dizziness 09/05/2013  . Coronary artery disease involving native coronary artery of native heart without angina pectoris 07/18/2013  . Palpitations 05/09/2012  . Atypical chest pain 04/24/2012  . Mixed hyperlipidemia 04/24/2012  . Elevated blood pressure 04/24/2012  . Postop check 05/25/2011  . Rilght upper back lipoma 04/13/2011    Scot Jun, PTA 12/25/2018, 9:46 AM  [place], [place], [address] Phone: [phone]   Fax:  [phone]  Name: SHARIFF LASKY MRN:  [id number] Date of Birth: 10-Jul-1960

## 2018-12-28 ENCOUNTER — Other Ambulatory Visit: Payer: Self-pay

## 2018-12-28 ENCOUNTER — Ambulatory Visit: Payer: 59 | Admitting: Physical Therapy

## 2018-12-28 ENCOUNTER — Encounter: Payer: Self-pay | Admitting: Physical Therapy

## 2018-12-28 DIAGNOSIS — M6281 Muscle weakness (generalized): Secondary | ICD-10-CM | POA: Diagnosis not present

## 2018-12-28 DIAGNOSIS — M5442 Lumbago with sciatica, left side: Secondary | ICD-10-CM

## 2018-12-28 NOTE — Therapy (Signed)
[place], [place], [address] Phone: [phone]   Fax:  [phone]  Physical Therapy Treatment  Patient Details  Name: Steven Henson MRN: [id number] Date of Birth: May 20, 1961 Referring Provider (PT): Saintclair Halsted   Encounter Date: 12/28/2018  PT End of Session - 12/28/18 1535    Visit Number  11    Date for PT Re-Evaluation  01/15/19    PT Start Time  1450    PT Stop Time  1535    PT Time Calculation (min)  45 min    Activity Tolerance  Patient tolerated treatment well    Behavior During Therapy  [facility] for tasks assessed/performed       Past Medical History:  Diagnosis Date  . Arthritis    lower back  . Coronary artery disease   . Family history of adverse reaction to anesthesia    "my mother had a hard waking up from anesthesia"  . H/O echocardiogram 12/04/1997   Normal LV systolic function. No valvular abnormalities.  . H/O myocardial perfusion scan 05/20/2009   The post stress myocardial perfusion images show a normal pattern of perfusion in all regions. the post stress left ventricle is normal in size. no significant wall motion abnormalities noted. Normal myocardial perfusion study. this is a low risk scan.  Marland Kitchen History of kidney stones    "lots"  . Hyperlipidemia   . Hypertension   . Kidney stones   . Lipoma    on back  . Sleep apnea    no CPAP use - denies apnea, waking up gasping/choking, denies daytime sleepiness    Past Surgical History:  Procedure Laterality Date  . BACK SURGERY     Micro surgery at [facility] 2012?  . CARDIAC CATHETERIZATION  > 10 yrs.  . CYSTOSCOPY/RETROGRADE/URETEROSCOPY/STONE EXTRACTION WITH BASKET  1/02, 12/01  . FRACTURE SURGERY  1974   right leg, both humerus, right wrist  . KIDNEY STONE SURGERY  2006  . LIPOMA EXCISION  05/14/2011   Procedure: EXCISION LIPOMA;  Surgeon: Belva Crome, MD;  Location: [place];  Service: General;   Laterality: Right;  EXCISION RIGHT UPPER BACK LIPOMA  . ORIF FINGER / THUMB FRACTURE  2002   right  . SHOULDER ARTHROSCOPY W/ SUBACROMIAL DECOMPRESSION AND DISTAL CLAVICLE EXCISION  7/08   left    There were no vitals filed for this visit.  Subjective Assessment - 12/28/18 1451    Subjective  Pt reports playing pickle ball yesterday, not feeling too bad today    Limitations  Walking;House hold activities;Lifting;Standing    Currently in Pain?  No/denies                       [facility] Adult PT Treatment/Exercise - 12/28/18 0001      Lumbar Exercises: Stretches   Passive Hamstring Stretch  Left;Right;4 reps;20 seconds    Single Knee to Chest Stretch  Left;Right;3 reps;20 seconds    Gastroc Stretch  Right;Left;3 reps;20 seconds      Lumbar Exercises: Aerobic   Elliptical  I10 R7 x2 min each wy    Nustep  level 5 x 6 minutes      Lumbar Exercises: Machines for Strengthening   Cybex Lumbar Extension  balck band 2x15     Cybex Knee Extension  20# 3x10    Cybex Knee Flexion  35# 2x10    Leg Press  70lb 2x15, SL 30lb  x12 each     Other Lumbar Machine Exercise  Rows and lats 45lb 2x15; chest press 25lb 2x15      Lumbar Exercises: Standing   Shoulder Extension  Theraband;Both;20 reps;Strengthening    Shoulder Extension Limitations  15      Lumbar Exercises: Seated   Sit to Stand  10 reps   x2 with OHP      Lumbar Exercises: Supine   Bridge  Compliant;2 seconds;15 reps                  PT Long Term Goals - 12/28/18 1536      PT LONG TERM GOAL #1   Title  return to gym setting safely    Status  Partially Met      PT LONG TERM GOAL #2   Title  return to pickelball without pain    Status  Partially Met      PT LONG TERM GOAL #3   Title  increase left hip flexor strength to 4/5    Status  Partially Met      PT LONG TERM GOAL #4   Title  mow the yard without difficulty    Status  Achieved            Plan - 12/28/18 1536    Clinical  Impression Statement  Pt tolerated today's interventions well, however he does fatigue quick with activity. Functional weakness noted with some activity.  Pt loses his posture with seated rows as fatigue sets in during the last few reps. Tolerated increase resistance on leg press bilaterally but unable to complete all of the reps with one leg.    Stability/Clinical Decision Making  Evolving/Moderate complexity    Rehab Potential  Good    PT Frequency  2x / week    PT Duration  8 weeks    PT Treatment/Interventions  ADLs/Self Care Home Management;Cryotherapy;Electrical Stimulation;Moist Heat;Therapeutic activities;Therapeutic exercise;Neuromuscular re-education;Patient/family education;Manual techniques    PT Next Visit Plan  exercises to help with overall strength and conditioning, flexibility and core stability       Patient will benefit from skilled therapeutic intervention in order to improve the following deficits and impairments:  Abnormal gait, Difficulty walking, Increased muscle spasms, Improper body mechanics, Decreased range of motion, Decreased activity tolerance, Decreased strength, Impaired flexibility, Postural dysfunction, Pain  Visit Diagnosis: 1. Muscle weakness (generalized)   2. Acute bilateral low back pain with left-sided sciatica        Problem List Patient Active Problem List   Diagnosis Date Noted  . Spinal stenosis at L4-L5 level 08/28/2018  . Lightheadedness 10/20/2017  . Orthostatic lightheadedness 04/22/2017  . Dyspnea on exertion 04/22/2017  . Dizziness 09/05/2013  . Coronary artery disease involving native coronary artery of native heart without angina pectoris 07/18/2013  . Palpitations 05/09/2012  . Atypical chest pain 04/24/2012  . Mixed hyperlipidemia 04/24/2012  . Elevated blood pressure 04/24/2012  . Postop check 05/25/2011  . Rilght upper back lipoma 04/13/2011    Scot Jun, PTA 12/28/2018, 3:42 PM  [place], [place], [address] Phone: [phone]   Fax:  [phone]  Name: HARUN BRUMLEY MRN: [id number] Date of Birth: Jan 05, 1961

## 2019-01-01 ENCOUNTER — Encounter: Payer: Self-pay | Admitting: Physical Therapy

## 2019-01-01 ENCOUNTER — Ambulatory Visit: Payer: 59 | Admitting: Physical Therapy

## 2019-01-01 ENCOUNTER — Other Ambulatory Visit: Payer: Self-pay

## 2019-01-01 DIAGNOSIS — M5442 Lumbago with sciatica, left side: Secondary | ICD-10-CM

## 2019-01-01 DIAGNOSIS — M6281 Muscle weakness (generalized): Secondary | ICD-10-CM | POA: Diagnosis not present

## 2019-01-01 NOTE — Therapy (Signed)
[place], [place], [address] Phone: [phone]   Fax:  [phone]  Physical Therapy Treatment  Patient Details  Name: Steven Henson MRN: [id number] Date of Birth: 03-03-1961 Referring Provider (PT): Saintclair Halsted   Encounter Date: 01/01/2019  PT End of Session - 01/01/19 0948    Visit Number  12    Date for PT Re-Evaluation  01/15/19    PT Start Time  0856    PT Stop Time  0945    PT Time Calculation (min)  49 min    Activity Tolerance  Patient tolerated treatment well    Behavior During Therapy  [facility] for tasks assessed/performed       Past Medical History:  Diagnosis Date  . Arthritis    lower back  . Coronary artery disease   . Family history of adverse reaction to anesthesia    "my mother had a hard waking up from anesthesia"  . H/O echocardiogram 12/04/1997   Normal LV systolic function. No valvular abnormalities.  . H/O myocardial perfusion scan 05/20/2009   The post stress myocardial perfusion images show a normal pattern of perfusion in all regions. the post stress left ventricle is normal in size. no significant wall motion abnormalities noted. Normal myocardial perfusion study. this is a low risk scan.  Marland Kitchen History of kidney stones    "lots"  . Hyperlipidemia   . Hypertension   . Kidney stones   . Lipoma    on back  . Sleep apnea    no CPAP use - denies apnea, waking up gasping/choking, denies daytime sleepiness    Past Surgical History:  Procedure Laterality Date  . BACK SURGERY     Micro surgery at [facility] 2012?  . CARDIAC CATHETERIZATION  > 10 yrs.  . CYSTOSCOPY/RETROGRADE/URETEROSCOPY/STONE EXTRACTION WITH BASKET  1/02, 12/01  . FRACTURE SURGERY  1974   right leg, both humerus, right wrist  . KIDNEY STONE SURGERY  2006  . LIPOMA EXCISION  05/14/2011   Procedure: EXCISION LIPOMA;  Surgeon: Belva Crome, MD;  Location: [place];  Service: General;   Laterality: Right;  EXCISION RIGHT UPPER BACK LIPOMA  . ORIF FINGER / THUMB FRACTURE  2002   right  . SHOULDER ARTHROSCOPY W/ SUBACROMIAL DECOMPRESSION AND DISTAL CLAVICLE EXCISION  7/08   left    There were no vitals filed for this visit.  Subjective Assessment - 01/01/19 0855    Subjective  "Pretty good" Pt reports some expected soreness after activity    Currently in Pain?  No/denies    Pain Score  0-No pain         [facility] PT Assessment - 01/01/19 0001      Strength   Left Hip Flexion  4+/5    Left Knee Flexion  4-/5                   [facility] Adult PT Treatment/Exercise - 01/01/19 0001      Lumbar Exercises: Aerobic   Elliptical  I10 R5 x3 min each wy    UBE (Upper Arm Bike)  Constant Work 40 watts x 4 min switching on the min      Lumbar Exercises: Machines for Strengthening   Cybex Lumbar Extension  balck band 2x15     Cybex Knee Extension  25# 3x10    Cybex Knee Flexion  35# 2x15, LLE 20lb  x10    Leg Press  80lb 2x15, SL 40lb x10 each, Heel raises 80lb 2x15    Other Lumbar Machine Exercise  Rows and lats 55lb 2x10; chest press 25lb 2x15      Lumbar Exercises: Standing   Shoulder Extension  Theraband;Both;20 reps;Strengthening    Shoulder Extension Limitations  15    Other Standing Lumbar Exercises  Standing overhead ext 2x10 blue ball                   PT Long Term Goals - 01/01/19 0950      PT LONG TERM GOAL #3   Title  increase left hip flexor strength to 4/5    Status  Achieved            Plan - 01/01/19 0949    Clinical Impression Statement  Pt continues to progress towards goals, he has increased his L hip flexor strength but L HS remains weak. Increase resistance tolerated on all machine level interventions. His LLE really fatigues with SL activities. Postural cues needed for shoulder ext and overhead extensions.    Stability/Clinical Decision Making  Evolving/Moderate complexity    Rehab Potential  Good    PT Frequency  2x  / week    PT Duration  8 weeks    PT Treatment/Interventions  ADLs/Self Care Home Management;Cryotherapy;Electrical Stimulation;Moist Heat;Therapeutic activities;Therapeutic exercise;Neuromuscular re-education;Patient/family education;Manual techniques    PT Next Visit Plan  exercises to help with overall strength and continuation, flexibility and core stability       Patient will benefit from skilled therapeutic intervention in order to improve the following deficits and impairments:  Abnormal gait, Difficulty walking, Increased muscle spasms, Improper body mechanics, Decreased range of motion, Decreased activity tolerance, Decreased strength, Impaired flexibility, Postural dysfunction, Pain  Visit Diagnosis: 1. Acute bilateral low back pain with left-sided sciatica        Problem List Patient Active Problem List   Diagnosis Date Noted  . Spinal stenosis at L4-L5 level 08/28/2018  . Lightheadedness 10/20/2017  . Orthostatic lightheadedness 04/22/2017  . Dyspnea on exertion 04/22/2017  . Dizziness 09/05/2013  . Coronary artery disease involving native coronary artery of native heart without angina pectoris 07/18/2013  . Palpitations 05/09/2012  . Atypical chest pain 04/24/2012  . Mixed hyperlipidemia 04/24/2012  . Elevated blood pressure 04/24/2012  . Postop check 05/25/2011  . Rilght upper back lipoma 04/13/2011    Scot Jun, PTA 01/01/2019, 9:51 AM  [place], [place], [address] Phone: [phone]   Fax:  [phone]  Name: Steven Henson MRN: [id number] Date of Birth: Nov 09, 1960

## 2019-01-04 ENCOUNTER — Encounter: Payer: Self-pay | Admitting: Physical Therapy

## 2019-01-04 ENCOUNTER — Ambulatory Visit: Payer: 59 | Attending: Neurosurgery | Admitting: Physical Therapy

## 2019-01-04 ENCOUNTER — Other Ambulatory Visit: Payer: Self-pay

## 2019-01-04 DIAGNOSIS — M5442 Lumbago with sciatica, left side: Secondary | ICD-10-CM | POA: Diagnosis not present

## 2019-01-04 DIAGNOSIS — M6281 Muscle weakness (generalized): Secondary | ICD-10-CM | POA: Diagnosis present

## 2019-01-04 NOTE — Therapy (Signed)
[place], [place], [address] Phone: [phone]   Fax:  [phone]  Physical Therapy Treatment  Patient Details  Name: Steven Henson MRN: [id number] Date of Birth: May 18, 1961 Referring Provider (PT): Saintclair Halsted   Encounter Date: 01/04/2019  PT End of Session - 01/04/19 0957    Visit Number  13    Date for PT Re-Evaluation  01/15/19    PT Start Time  0900    PT Stop Time  0945    PT Time Calculation (min)  45 min    Activity Tolerance  Patient tolerated treatment well    Behavior During Therapy  [facility] for tasks assessed/performed       Past Medical History:  Diagnosis Date  . Arthritis    lower back  . Coronary artery disease   . Family history of adverse reaction to anesthesia    "my mother had a hard waking up from anesthesia"  . H/O echocardiogram 12/04/1997   Normal LV systolic function. No valvular abnormalities.  . H/O myocardial perfusion scan 05/20/2009   The post stress myocardial perfusion images show a normal pattern of perfusion in all regions. the post stress left ventricle is normal in size. no significant wall motion abnormalities noted. Normal myocardial perfusion study. this is a low risk scan.  Marland Kitchen History of kidney stones    "lots"  . Hyperlipidemia   . Hypertension   . Kidney stones   . Lipoma    on back  . Sleep apnea    no CPAP use - denies apnea, waking up gasping/choking, denies daytime sleepiness    Past Surgical History:  Procedure Laterality Date  . BACK SURGERY     Micro surgery at [facility] 2012?  . CARDIAC CATHETERIZATION  > 10 yrs.  . CYSTOSCOPY/RETROGRADE/URETEROSCOPY/STONE EXTRACTION WITH BASKET  1/02, 12/01  . FRACTURE SURGERY  1974   right leg, both humerus, right wrist  . KIDNEY STONE SURGERY  2006  . LIPOMA EXCISION  05/14/2011   Procedure: EXCISION LIPOMA;  Surgeon: Belva Crome, MD;  Location: [place];  Service: General;   Laterality: Right;  EXCISION RIGHT UPPER BACK LIPOMA  . ORIF FINGER / THUMB FRACTURE  2002   right  . SHOULDER ARTHROSCOPY W/ SUBACROMIAL DECOMPRESSION AND DISTAL CLAVICLE EXCISION  7/08   left    There were no vitals filed for this visit.  Subjective Assessment - 01/04/19 0856    Subjective  "Sore, played pickle ball the last two days "    Limitations  Walking;House hold activities;Lifting;Standing    Patient Stated Goals  get stronger, feel better    Currently in Pain?  No/denies                       [facility] Adult PT Treatment/Exercise - 01/04/19 0001      Lumbar Exercises: Aerobic   Elliptical  I10 R5 x3 min each wy    UBE (Upper Arm Bike)  Constant Work 40 watts x 4 min switching on the min      Lumbar Exercises: Machines for Strengthening   Cybex Lumbar Extension  balck band 2x15     Leg Press  90lb 3x10, SL 50lb x10 each     Other Lumbar Machine Exercise  Rows and lats 55lb 2x10; chest press 35lb 2x15      Lumbar Exercises: Standing   Heel Raises  --   x2  Shoulder Extension  Theraband;Both;20 reps;Strengthening    Shoulder Extension Limitations  15    Other Standing Lumbar Exercises  Standing overhead ext 2x10 blue ball     Other Standing Lumbar Exercises  unilateral 20lb farmers carry on stairs2 each, Overhead 10lb farmers carry                   PT Long Term Goals - 01/01/19 0950      PT LONG TERM GOAL #3   Title  increase left hip flexor strength to 4/5    Status  Achieved            Plan - 01/04/19 0958    Clinical Impression Statement  Increase weight tolerated on leg press, LLE ~ to be weaker than R with single leg. No issues with seated rows and lats. Visible core and bilat LE weakness noted with stair negotiation farmers carry. Some eccentric load weakness noted with resisted gait. LOX x 2 with resisted side steps,    Stability/Clinical Decision Making  Evolving/Moderate complexity    Rehab Potential  Good    PT Frequency   2x / week    PT Duration  8 weeks    PT Next Visit Plan  exercises to help with overall strength and continutioning, flexibility and core stability       Patient will benefit from skilled therapeutic intervention in order to improve the following deficits and impairments:  Abnormal gait, Difficulty walking, Increased muscle spasms, Improper body mechanics, Decreased range of motion, Decreased activity tolerance, Decreased strength, Impaired flexibility, Postural dysfunction, Pain  Visit Diagnosis: 1. Acute bilateral low back pain with left-sided sciatica   2. Muscle weakness (generalized)        Problem List Patient Active Problem List   Diagnosis Date Noted  . Spinal stenosis at L4-L5 level 08/28/2018  . Lightheadedness 10/20/2017  . Orthostatic lightheadedness 04/22/2017  . Dyspnea on exertion 04/22/2017  . Dizziness 09/05/2013  . Coronary artery disease involving native coronary artery of native heart without angina pectoris 07/18/2013  . Palpitations 05/09/2012  . Atypical chest pain 04/24/2012  . Mixed hyperlipidemia 04/24/2012  . Elevated blood pressure 04/24/2012  . Postop check 05/25/2011  . Rilght upper back lipoma 04/13/2011    Scot Jun 01/04/2019, 10:07 AM, PTA  [facility] [place], [place], [address] Phone: [phone]   Fax:  [phone]  Name: Steven Henson MRN: [id number] Date of Birth: 1960-09-15

## 2019-01-08 ENCOUNTER — Encounter: Payer: Self-pay | Admitting: Physical Therapy

## 2019-01-08 ENCOUNTER — Ambulatory Visit: Payer: 59 | Admitting: Physical Therapy

## 2019-01-08 ENCOUNTER — Other Ambulatory Visit: Payer: Self-pay

## 2019-01-08 DIAGNOSIS — M5442 Lumbago with sciatica, left side: Secondary | ICD-10-CM

## 2019-01-08 DIAGNOSIS — M6281 Muscle weakness (generalized): Secondary | ICD-10-CM

## 2019-01-08 NOTE — Therapy (Signed)
[place], [place], [address] Phone: [phone]   Fax:  [phone]  Physical Therapy Treatment  Patient Details  Name: Steven Henson MRN: [id number] Date of Birth: 12-31-1960 Referring Provider (PT): Rico Ala Date: 01/08/2019  PT End of Session - 01/08/19 0848    Visit Number  14    Date for PT Re-Evaluation  01/15/19    PT Start Time  0800    PT Stop Time  0946    PT Time Calculation (min)  106 min    Activity Tolerance  Patient tolerated treatment well    Behavior During Therapy  [facility] for tasks assessed/performed       Past Medical History:  Diagnosis Date  . Arthritis    lower back  . Coronary artery disease   . Family history of adverse reaction to anesthesia    "my mother had a hard waking up from anesthesia"  . H/O echocardiogram 12/04/1997   Normal LV systolic function. No valvular abnormalities.  . H/O myocardial perfusion scan 05/20/2009   The post stress myocardial perfusion images show a normal pattern of perfusion in all regions. the post stress left ventricle is normal in size. no significant wall motion abnormalities noted. Normal myocardial perfusion study. this is a low risk scan.  Marland Kitchen History of kidney stones    "lots"  . Hyperlipidemia   . Hypertension   . Kidney stones   . Lipoma    on back  . Sleep apnea    no CPAP use - denies apnea, waking up gasping/choking, denies daytime sleepiness    Past Surgical History:  Procedure Laterality Date  . BACK SURGERY     Micro surgery at [facility] 2012?  . CARDIAC CATHETERIZATION  > 10 yrs.  . CYSTOSCOPY/RETROGRADE/URETEROSCOPY/STONE EXTRACTION WITH BASKET  1/02, 12/01  . FRACTURE SURGERY  1974   right leg, both humerus, right wrist  . KIDNEY STONE SURGERY  2006  . LIPOMA EXCISION  05/14/2011   Procedure: EXCISION LIPOMA;  Surgeon: Belva Crome, MD;  Location: [place];  Service: General;   Laterality: Right;  EXCISION RIGHT UPPER BACK LIPOMA  . ORIF FINGER / THUMB FRACTURE  2002   right  . SHOULDER ARTHROSCOPY W/ SUBACROMIAL DECOMPRESSION AND DISTAL CLAVICLE EXCISION  7/08   left    There were no vitals filed for this visit.  Subjective Assessment - 01/08/19 0802    Subjective  Pt reports that he has som low back soreness in the R side mostly. Played pickle ball Friday and mowed the front lawn    Limitations  Walking;House hold activities;Lifting;Standing    Patient Stated Goals  get stronger, feel better    Currently in Pain?  No/denies    Pain Location  Back    Pain Orientation  Right;Lower                       [facility] Adult PT Treatment/Exercise - 01/08/19 0001      Lumbar Exercises: Stretches   Passive Hamstring Stretch  Left;Right;4 reps;20 seconds    Single Knee to Chest Stretch  Left;Right;3 reps;20 seconds    Lower Trunk Rotation  20 seconds;2 reps      Lumbar Exercises: Aerobic   Elliptical  I10 R5 x3 min each wy    UBE (Upper Arm Bike)  Constant Work 40 watts x 4 min switching on the min  Lumbar Exercises: Machines for Strengthening   Cybex Lumbar Extension  balck band 2x15     Cybex Knee Extension  25# 2x15    Cybex Knee Flexion  35# 2x15    Other Lumbar Machine Exercise  Rows and lats 55lb 2x15; chest press 35lb 2x15      Lumbar Exercises: Standing   Functional Squats  10 reps;2 seconds    Shoulder Extension  Theraband;Both;20 reps;Strengthening    Shoulder Extension Limitations  20    Other Standing Lumbar Exercises  Standing overhead ext 2x10 blue ball                   PT Long Term Goals - 01/01/19 0950      PT LONG TERM GOAL #3   Title  increase left hip flexor strength to 4/5    Status  Achieved            Plan - 01/08/19 0848    Clinical Impression Statement  Overall pt did well with today's activities. Did less intense activities due to pt subjective reports of low back soreness. Postural cues  required for standing overhead extensions. No reports of increase pain. Bilat HS tightness remains. Cues needed to push hips back with functional squats.    Stability/Clinical Decision Making  Evolving/Moderate complexity    Rehab Potential  Good    PT Frequency  2x / week    PT Treatment/Interventions  ADLs/Self Care Home Management;Cryotherapy;Electrical Stimulation;Moist Heat;Therapeutic activities;Therapeutic exercise;Neuromuscular re-education;Patient/family education;Manual techniques    PT Next Visit Plan  exercises to help with overall strength and conditioning, flexibility and core stability       Patient will benefit from skilled therapeutic intervention in order to improve the following deficits and impairments:  Abnormal gait, Difficulty walking, Increased muscle spasms, Improper body mechanics, Decreased range of motion, Decreased activity tolerance, Decreased strength, Impaired flexibility, Postural dysfunction, Pain  Visit Diagnosis: 1. Muscle weakness (generalized)   2. Acute bilateral low back pain with left-sided sciatica        Problem List Patient Active Problem List   Diagnosis Date Noted  . Spinal stenosis at L4-L5 level 08/28/2018  . Lightheadedness 10/20/2017  . Orthostatic lightheadedness 04/22/2017  . Dyspnea on exertion 04/22/2017  . Dizziness 09/05/2013  . Coronary artery disease involving native coronary artery of native heart without angina pectoris 07/18/2013  . Palpitations 05/09/2012  . Atypical chest pain 04/24/2012  . Mixed hyperlipidemia 04/24/2012  . Elevated blood pressure 04/24/2012  . Postop check 05/25/2011  . Rilght upper back lipoma 04/13/2011    Scot Jun, PTA 01/08/2019, 8:50 AM  [place], [place], [address] Phone: [phone]   Fax:  [phone]  Name: TAIQUAN CAMPANARO MRN: [id number] Date of Birth: May 01, 1961

## 2019-01-10 ENCOUNTER — Ambulatory Visit: Payer: 59 | Admitting: Physical Therapy

## 2019-01-10 ENCOUNTER — Other Ambulatory Visit: Payer: Self-pay

## 2019-01-10 ENCOUNTER — Encounter: Payer: Self-pay | Admitting: Physical Therapy

## 2019-01-10 DIAGNOSIS — M5442 Lumbago with sciatica, left side: Secondary | ICD-10-CM | POA: Diagnosis not present

## 2019-01-10 DIAGNOSIS — M6281 Muscle weakness (generalized): Secondary | ICD-10-CM

## 2019-01-10 NOTE — Therapy (Signed)
[place], [place], [address] Phone: [phone]   Fax:  [phone]  Physical Therapy Treatment  Patient Details  Name: Steven Henson MRN: [id number] Date of Birth: 08-05-1960 Referring Provider (PT): Saintclair Halsted   Encounter Date: 01/10/2019  PT End of Session - 01/10/19 0903    Visit Number  15    Date for PT Re-Evaluation  01/15/19    PT Start Time  0840    PT Stop Time  0900    PT Time Calculation (min)  20 min    Activity Tolerance  Patient tolerated treatment well    Behavior During Therapy  [facility] for tasks assessed/performed       Past Medical History:  Diagnosis Date  . Arthritis    lower back  . Coronary artery disease   . Family history of adverse reaction to anesthesia    "my mother had a hard waking up from anesthesia"  . H/O echocardiogram 12/04/1997   Normal LV systolic function. No valvular abnormalities.  . H/O myocardial perfusion scan 05/20/2009   The post stress myocardial perfusion images show a normal pattern of perfusion in all regions. the post stress left ventricle is normal in size. no significant wall motion abnormalities noted. Normal myocardial perfusion study. this is a low risk scan.  Marland Kitchen History of kidney stones    "lots"  . Hyperlipidemia   . Hypertension   . Kidney stones   . Lipoma    on back  . Sleep apnea    no CPAP use - denies apnea, waking up gasping/choking, denies daytime sleepiness    Past Surgical History:  Procedure Laterality Date  . BACK SURGERY     Micro surgery at [facility] 2012?  . CARDIAC CATHETERIZATION  > 10 yrs.  . CYSTOSCOPY/RETROGRADE/URETEROSCOPY/STONE EXTRACTION WITH BASKET  1/02, 12/01  . FRACTURE SURGERY  1974   right leg, both humerus, right wrist  . KIDNEY STONE SURGERY  2006  . LIPOMA EXCISION  05/14/2011   Procedure: EXCISION LIPOMA;  Surgeon: Belva Crome, MD;  Location: [place];  Service: General;   Laterality: Right;  EXCISION RIGHT UPPER BACK LIPOMA  . ORIF FINGER / THUMB FRACTURE  2002   right  . SHOULDER ARTHROSCOPY W/ SUBACROMIAL DECOMPRESSION AND DISTAL CLAVICLE EXCISION  7/08   left    There were no vitals filed for this visit.  Subjective Assessment - 01/10/19 0841    Subjective  Muscles in his back is sore, wanted to leave therapy early to go play pickle ball    Currently in Pain?  No/denies    Pain Score  0-No pain                       [facility] Adult PT Treatment/Exercise - 01/10/19 0001      Lumbar Exercises: Stretches   Passive Hamstring Stretch  Left;Right;4 reps;20 seconds    Single Knee to Chest Stretch  Left;Right;3 reps;20 seconds    Double Knee to Chest Stretch  3 reps;10 seconds    Lower Trunk Rotation  20 seconds;2 reps    Piriformis Stretch  Left;Right;3 reps;20 seconds      Lumbar Exercises: Aerobic   Elliptical  I12 R6 x2 min each wy    UBE (Upper Arm Bike)  Constant Work 40 watts x 4 min switching on the min  PT Long Term Goals - 01/01/19 0950      PT LONG TERM GOAL #3   Title  increase left hip flexor strength to 4/5    Status  Achieved            Plan - 01/10/19 0906    Clinical Impression Statement  Pr reports some low back soreness, elected to only have stretching. He plans to go play pickle ball this morning.    Stability/Clinical Decision Making  Evolving/Moderate complexity    PT Frequency  2x / week    PT Duration  8 weeks    PT Treatment/Interventions  ADLs/Self Care Home Management;Cryotherapy;Electrical Stimulation;Moist Heat;Therapeutic activities;Therapeutic exercise;Neuromuscular re-education;Patient/family education;Manual techniques    PT Next Visit Plan  exercises to help with overall strength and conditioning, flexibility and core stability       Patient will benefit from skilled therapeutic intervention in order to improve the following deficits and impairments:  Abnormal  gait, Difficulty walking, Increased muscle spasms, Improper body mechanics, Decreased range of motion, Decreased activity tolerance, Decreased strength, Impaired flexibility, Postural dysfunction, Pain  Visit Diagnosis: 1. Acute bilateral low back pain with left-sided sciatica   2. Muscle weakness (generalized)        Problem List Patient Active Problem List   Diagnosis Date Noted  . Spinal stenosis at L4-L5 level 08/28/2018  . Lightheadedness 10/20/2017  . Orthostatic lightheadedness 04/22/2017  . Dyspnea on exertion 04/22/2017  . Dizziness 09/05/2013  . Coronary artery disease involving native coronary artery of native heart without angina pectoris 07/18/2013  . Palpitations 05/09/2012  . Atypical chest pain 04/24/2012  . Mixed hyperlipidemia 04/24/2012  . Elevated blood pressure 04/24/2012  . Postop check 05/25/2011  . Rilght upper back lipoma 04/13/2011    Scot Jun, PTA 01/10/2019, 9:07 AM  [place], [place], [address] Phone: [phone]   Fax:  [phone]  Name: Steven Henson MRN: [id number] Date of Birth: [birth year]

## 2019-01-15 ENCOUNTER — Other Ambulatory Visit: Payer: Self-pay

## 2019-01-15 ENCOUNTER — Encounter: Payer: Self-pay | Admitting: Physical Therapy

## 2019-01-15 ENCOUNTER — Ambulatory Visit: Payer: 59 | Admitting: Physical Therapy

## 2019-01-15 DIAGNOSIS — M5442 Lumbago with sciatica, left side: Secondary | ICD-10-CM

## 2019-01-15 DIAGNOSIS — M6281 Muscle weakness (generalized): Secondary | ICD-10-CM

## 2019-01-15 NOTE — Therapy (Signed)
[place], [place], [address] Phone: [phone]   Fax:  [phone]  Physical Therapy Treatment  Patient Details  Name: Steven Henson MRN: [id number] Date of Birth: Jun 18, 1961 Referring Provider (PT): Saintclair Halsted   Encounter Date: 01/15/2019  PT End of Session - 01/15/19 1652    Visit Number  16    Date for PT Re-Evaluation  01/15/19    PT Start Time  1600    PT Stop Time  1645    PT Time Calculation (min)  45 min       Past Medical History:  Diagnosis Date  . Arthritis    lower back  . Coronary artery disease   . Family history of adverse reaction to anesthesia    "my mother had a hard waking up from anesthesia"  . H/O echocardiogram 12/04/1997   Normal LV systolic function. No valvular abnormalities.  . H/O myocardial perfusion scan 05/20/2009   The post stress myocardial perfusion images show a normal pattern of perfusion in all regions. the post stress left ventricle is normal in size. no significant wall motion abnormalities noted. Normal myocardial perfusion study. this is a low risk scan.  Marland Kitchen History of kidney stones    "lots"  . Hyperlipidemia   . Hypertension   . Kidney stones   . Lipoma    on back  . Sleep apnea    no CPAP use - denies apnea, waking up gasping/choking, denies daytime sleepiness    Past Surgical History:  Procedure Laterality Date  . BACK SURGERY     Micro surgery at [facility] 2012?  . CARDIAC CATHETERIZATION  > 10 yrs.  . CYSTOSCOPY/RETROGRADE/URETEROSCOPY/STONE EXTRACTION WITH BASKET  1/02, 12/01  . FRACTURE SURGERY  1974   right leg, both humerus, right wrist  . KIDNEY STONE SURGERY  2006  . LIPOMA EXCISION  05/14/2011   Procedure: EXCISION LIPOMA;  Surgeon: Belva Crome, MD;  Location: [place];  Service: General;  Laterality: Right;  EXCISION RIGHT UPPER BACK LIPOMA  . ORIF FINGER / THUMB FRACTURE  2002   right  . SHOULDER  ARTHROSCOPY W/ SUBACROMIAL DECOMPRESSION AND DISTAL CLAVICLE EXCISION  7/08   left    There were no vitals filed for this visit.  Subjective Assessment - 01/15/19 1602    Subjective  "Feeling pretty good today actually"    Limitations  Walking;House hold activities;Lifting;Standing    Patient Stated Goals  get stronger, feel better    Currently in Pain?  No/denies                       [facility] Adult PT Treatment/Exercise - 01/15/19 0001      Lumbar Exercises: Stretches   Passive Hamstring Stretch  Left;Right;4 reps;20 seconds    Single Knee to Chest Stretch  Left;Right;3 reps;20 seconds    Piriformis Stretch  Left;Right;3 reps;20 seconds      Lumbar Exercises: Aerobic   Elliptical  I13 R9 x2 min each wy    UBE (Upper Arm Bike)  Constant Work 40 watts x 4 min switching on the min      Lumbar Exercises: Machines for Strengthening   Cybex Knee Extension  25# 2x15    Cybex Knee Flexion  35# 2x15    Leg Press  90lb x15, SL 50lb x10 each     Other Lumbar Machine Exercise  Rows and lats 65lb 2x15; chest press  45lb 3x10      Lumbar Exercises: Standing   Other Standing Lumbar Exercises  Standing overhead ext 2x10 blue ball; OHP 10lb 2x10     Other Standing Lumbar Exercises  unilateral 20lb farmers carry on stairs2 each, Overhead 10lb farmers carry                   PT Long Term Goals - 01/15/19 1652      PT LONG TERM GOAL #1   Title  return to gym setting safely      PT LONG TERM GOAL #2   Title  return to pickelball without pain    Status  Partially Met      PT LONG TERM GOAL #4   Title  mow the yard without difficulty    Status  Achieved            Plan - 01/15/19 1654    Clinical Impression Statement  Pt completed all of today's interventions well. He does fatigue with activity. He reports less and less soreness after playing pickle ball. Some core weakness noted with farmers carry and OHP. Postural cues needed with seated rows and lats to  keep shoulders retracted.    Stability/Clinical Decision Making  Evolving/Moderate complexity    PT Frequency  2x / week    PT Duration  8 weeks    PT Treatment/Interventions  ADLs/Self Care Home Management;Cryotherapy;Electrical Stimulation;Moist Heat;Therapeutic activities;Therapeutic exercise;Neuromuscular re-education;Patient/family education;Manual techniques    PT Next Visit Plan  exercises to help with overall strength and conditioning, flexibility and core stability       Patient will benefit from skilled therapeutic intervention in order to improve the following deficits and impairments:  Abnormal gait, Difficulty walking, Increased muscle spasms, Improper body mechanics, Decreased range of motion, Decreased activity tolerance, Decreased strength, Impaired flexibility, Postural dysfunction, Pain  Visit Diagnosis: 1. Acute bilateral low back pain with left-sided sciatica   2. Muscle weakness (generalized)        Problem List Patient Active Problem List   Diagnosis Date Noted  . Spinal stenosis at L4-L5 level 08/28/2018  . Lightheadedness 10/20/2017  . Orthostatic lightheadedness 04/22/2017  . Dyspnea on exertion 04/22/2017  . Dizziness 09/05/2013  . Coronary artery disease involving native coronary artery of native heart without angina pectoris 07/18/2013  . Palpitations 05/09/2012  . Atypical chest pain 04/24/2012  . Mixed hyperlipidemia 04/24/2012  . Elevated blood pressure 04/24/2012  . Postop check 05/25/2011  . Rilght upper back lipoma 04/13/2011    Scot Jun, PTA 01/15/2019, 4:59 PM  [place], [place], [address] Phone: [phone]   Fax:  [phone]  Name: JASIRI HANAWALT MRN: [id number] Date of Birth: Sep 24, 1960

## 2019-01-22 ENCOUNTER — Other Ambulatory Visit: Payer: Self-pay

## 2019-01-22 ENCOUNTER — Ambulatory Visit: Payer: 59 | Admitting: Physical Therapy

## 2019-01-22 ENCOUNTER — Encounter: Payer: Self-pay | Admitting: Physical Therapy

## 2019-01-22 DIAGNOSIS — M6281 Muscle weakness (generalized): Secondary | ICD-10-CM

## 2019-01-22 DIAGNOSIS — M5442 Lumbago with sciatica, left side: Secondary | ICD-10-CM

## 2019-01-22 NOTE — Therapy (Signed)
[place], [place], [address] Phone: [phone]   Fax:  [phone]  Physical Therapy Treatment  Patient Details  Name: Steven Henson MRN: [id number] Date of Birth: 17-Jul-1960 Referring Provider (PT): Saintclair Halsted   Encounter Date: 01/22/2019  PT End of Session - 01/22/19 1428    Visit Number  17    Date for PT Re-Evaluation  01/15/19    PT Start Time  1343    PT Stop Time  1428    PT Time Calculation (min)  45 min    Activity Tolerance  Patient tolerated treatment well    Behavior During Therapy  [facility] for tasks assessed/performed       Past Medical History:  Diagnosis Date  . Arthritis    lower back  . Coronary artery disease   . Family history of adverse reaction to anesthesia    "my mother had a hard waking up from anesthesia"  . H/O echocardiogram 12/04/1997   Normal LV systolic function. No valvular abnormalities.  . H/O myocardial perfusion scan 05/20/2009   The post stress myocardial perfusion images show a normal pattern of perfusion in all regions. the post stress left ventricle is normal in size. no significant wall motion abnormalities noted. Normal myocardial perfusion study. this is a low risk scan.  Marland Kitchen History of kidney stones    "lots"  . Hyperlipidemia   . Hypertension   . Kidney stones   . Lipoma    on back  . Sleep apnea    no CPAP use - denies apnea, waking up gasping/choking, denies daytime sleepiness    Past Surgical History:  Procedure Laterality Date  . BACK SURGERY     Micro surgery at [facility] 2012?  . CARDIAC CATHETERIZATION  > 10 yrs.  . CYSTOSCOPY/RETROGRADE/URETEROSCOPY/STONE EXTRACTION WITH BASKET  1/02, 12/01  . FRACTURE SURGERY  1974   right leg, both humerus, right wrist  . KIDNEY STONE SURGERY  2006  . LIPOMA EXCISION  05/14/2011   Procedure: EXCISION LIPOMA;  Surgeon: Belva Crome, MD;  Location: [place];  Service: General;   Laterality: Right;  EXCISION RIGHT UPPER BACK LIPOMA  . ORIF FINGER / THUMB FRACTURE  2002   right  . SHOULDER ARTHROSCOPY W/ SUBACROMIAL DECOMPRESSION AND DISTAL CLAVICLE EXCISION  7/08   left    There were no vitals filed for this visit.  Subjective Assessment - 01/22/19 1340    Subjective  Pt reports that he is feeling good. Played pickle ball over the weekend, but he is "Still not playing at 100%"    Limitations  Walking;House hold activities;Lifting;Standing    Patient Stated Goals  get stronger, feel better    Currently in Pain?  No/denies                       [facility] Adult PT Treatment/Exercise - 01/22/19 0001      Lumbar Exercises: Stretches   Passive Hamstring Stretch  Left;Right;4 reps;20 seconds    Single Knee to Chest Stretch  Left;Right;3 reps;20 seconds    Piriformis Stretch  Left;Right;3 reps;20 seconds      Lumbar Exercises: Aerobic   Elliptical  I13 R7 x3 min each wy    UBE (Upper Arm Bike)  Constant Work 40 watts x 4 min switching on the min      Lumbar Exercises: Machines for Strengthening   Cybex Knee Extension  25# 2x15  Cybex Knee Flexion  45# 3x10    Leg Press  100 2x10, SL 60lb x 10 each     Other Lumbar Machine Exercise  Rows and lats 65lb 2x15; chest press 45lb 3x10      Lumbar Exercises: Standing   Heel Raises  15 reps;1 second    Other Standing Lumbar Exercises  Standing overhead ext 2x10 blue ball; OHP 10lb 2x10                   PT Long Term Goals - 01/15/19 1652      PT LONG TERM GOAL #1   Title  return to gym setting safely      PT LONG TERM GOAL #2   Title  return to pickelball without pain    Status  Partially Met      PT LONG TERM GOAL #4   Title  mow the yard without difficulty    Status  Achieved            Plan - 01/22/19 1429    Clinical Impression Statement  Pt continues to do well overall and completed all of today's exercises without pain. He does demo some LLE weakness on leg press. Pt  has a forward posture and rounded shoulders, he needed cues to retract scapulas with seated rows and to engage core.    Stability/Clinical Decision Making  Evolving/Moderate complexity    Rehab Potential  Good    PT Frequency  2x / week    PT Duration  8 weeks    PT Treatment/Interventions  ADLs/Self Care Home Management;Cryotherapy;Electrical Stimulation;Moist Heat;Therapeutic activities;Therapeutic exercise;Neuromuscular re-education;Patient/family education;Manual techniques    PT Next Visit Plan  exercises to help with overall strength and condishioning, flexibility and core stability       Patient will benefit from skilled therapeutic intervention in order to improve the following deficits and impairments:  Abnormal gait, Difficulty walking, Increased muscle spasms, Improper body mechanics, Decreased range of motion, Decreased activity tolerance, Decreased strength, Impaired flexibility, Postural dysfunction, Pain  Visit Diagnosis: 1. Muscle weakness (generalized)   2. Acute bilateral low back pain with left-sided sciatica        Problem List Patient Active Problem List   Diagnosis Date Noted  . Spinal stenosis at L4-L5 level 08/28/2018  . Lightheadedness 10/20/2017  . Orthostatic lightheadedness 04/22/2017  . Dyspnea on exertion 04/22/2017  . Dizziness 09/05/2013  . Coronary artery disease involving native coronary artery of native heart without angina pectoris 07/18/2013  . Palpitations 05/09/2012  . Atypical chest pain 04/24/2012  . Mixed hyperlipidemia 04/24/2012  . Elevated blood pressure 04/24/2012  . Postop check 05/25/2011  . Rilght upper back lipoma 04/13/2011    Scot Jun, PTA 01/22/2019, 2:32 PM  [place], [place], [address] Phone: [phone]   Fax:  [phone]  Name: MCGWIRE DASARO MRN: [id number] Date of Birth: [birth year]

## 2019-01-30 ENCOUNTER — Ambulatory Visit: Payer: 59 | Admitting: Physical Therapy

## 2019-02-02 ENCOUNTER — Encounter: Payer: Self-pay | Admitting: Physical Therapy

## 2019-02-02 ENCOUNTER — Ambulatory Visit: Payer: 59 | Admitting: Physical Therapy

## 2019-02-02 ENCOUNTER — Other Ambulatory Visit: Payer: Self-pay

## 2019-02-02 DIAGNOSIS — M6281 Muscle weakness (generalized): Secondary | ICD-10-CM

## 2019-02-02 DIAGNOSIS — M5442 Lumbago with sciatica, left side: Secondary | ICD-10-CM | POA: Diagnosis not present

## 2019-02-02 NOTE — Patient Instructions (Signed)
Access Code: [email or web address]  URL: [email or web address]  Date: 02/02/2019  Prepared by: Cheri Fowler   Exercises  Standing Row with Resistance - 10 reps - 3 sets - 1x daily - 7x weekly  Shoulder Extension with Resistance - 10 reps - 3 sets - 1x daily - 7x weekly  Supine Bridge - 10 reps - 3 sets - 1x daily - 7x weekly  Sit to Stand without Arm Support - 10 reps - 3 sets - 1x daily - 7x weekly

## 2019-02-02 NOTE — Therapy (Signed)
[place], [place], [address] Phone: [phone]   Fax:  [phone]  Physical Therapy Treatment  Patient Details  Name: ARICK MARENO MRN: [id number] Date of Birth: [birth year] Referring Provider (PT): Saintclair Halsted   Encounter Date: 02/02/2019  PT End of Session - 02/02/19 0943    Visit Number  18    Date for PT Re-Evaluation  01/15/19    PT Start Time  0930    PT Stop Time  0943    PT Time Calculation (min)  13 min    Activity Tolerance  Patient tolerated treatment well    Behavior During Therapy  [facility] for tasks assessed/performed       Past Medical History:  Diagnosis Date  . Arthritis    lower back  . Coronary artery disease   . Family history of adverse reaction to anesthesia    "my mother had a hard waking up from anesthesia"  . H/O echocardiogram 12/04/1997   Normal LV systolic function. No valvular abnormalities.  . H/O myocardial perfusion scan 05/20/2009   The post stress myocardial perfusion images show a normal pattern of perfusion in all regions. the post stress left ventricle is normal in size. no significant wall motion abnormalities noted. Normal myocardial perfusion study. this is a low risk scan.  Marland Kitchen History of kidney stones    "lots"  . Hyperlipidemia   . Hypertension   . Kidney stones   . Lipoma    on back  . Sleep apnea    no CPAP use - denies apnea, waking up gasping/choking, denies daytime sleepiness    Past Surgical History:  Procedure Laterality Date  . BACK SURGERY     Micro surgery at [facility] 2012?  . CARDIAC CATHETERIZATION  > 10 yrs.  . CYSTOSCOPY/RETROGRADE/URETEROSCOPY/STONE EXTRACTION WITH BASKET  1/02, 12/01  . FRACTURE SURGERY  1974   right leg, both humerus, right wrist  . KIDNEY STONE SURGERY  2006  . LIPOMA EXCISION  05/14/2011   Procedure: EXCISION LIPOMA;  Surgeon: Belva Crome, MD;  Location: [place];  Service: General;   Laterality: Right;  EXCISION RIGHT UPPER BACK LIPOMA  . ORIF FINGER / THUMB FRACTURE  2002   right  . SHOULDER ARTHROSCOPY W/ SUBACROMIAL DECOMPRESSION AND DISTAL CLAVICLE EXCISION  7/08   left    There were no vitals filed for this visit.  Subjective Assessment - 02/02/19 0929    Subjective  "Good"    Currently in Pain?  No/denies                       [facility] Adult PT Treatment/Exercise - 02/02/19 0001      Lumbar Exercises: Aerobic   Elliptical  I13 R7 x3 min each wy                  PT Long Term Goals - 02/02/19 0945      PT LONG TERM GOAL #1   Title  return to gym setting safely    Status  Partially Met      PT LONG TERM GOAL #2   Title  return to pickelball without pain    Status  Achieved      PT LONG TERM GOAL #3   Title  increase left hip flexor strength to 4/5    Status  Achieved      PT LONG TERM GOAL #4  Title  mow the yard without difficulty            Plan - 02/02/19 0945    Clinical Impression Statement  All goals met. Pt has been playing pickle ball daily. He is unable to return to the gym due to covid.    Stability/Clinical Decision Making  Evolving/Moderate complexity    Rehab Potential  Good    PT Frequency  2x / week    PT Duration  8 weeks    PT Treatment/Interventions  ADLs/Self Care Home Management;Cryotherapy;Electrical Stimulation;Moist Heat;Therapeutic activities;Therapeutic exercise;Neuromuscular re-education;Patient/family education;Manual techniques    PT Next Visit Plan  D/C PT       Patient will benefit from skilled therapeutic intervention in order to improve the following deficits and impairments:  Abnormal gait, Difficulty walking, Increased muscle spasms, Improper body mechanics, Decreased range of motion, Decreased activity tolerance, Decreased strength, Impaired flexibility, Postural dysfunction, Pain  Visit Diagnosis: 1. Acute bilateral low back pain with left-sided sciatica   2. Muscle  weakness (generalized)        Problem List Patient Active Problem List   Diagnosis Date Noted  . Spinal stenosis at L4-L5 level 08/28/2018  . Lightheadedness 10/20/2017  . Orthostatic lightheadedness 04/22/2017  . Dyspnea on exertion 04/22/2017  . Dizziness 09/05/2013  . Coronary artery disease involving native coronary artery of native heart without angina pectoris 07/18/2013  . Palpitations 05/09/2012  . Atypical chest pain 04/24/2012  . Mixed hyperlipidemia 04/24/2012  . Elevated blood pressure 04/24/2012  . Postop check 05/25/2011  . Rilght upper back lipoma 04/13/2011   PHYSICAL THERAPY DISCHARGE SUMMARY  Visits from Start of Care: 18  Plan: Patient agrees to discharge.  Patient goals were met. Patient is being discharged due to meeting the stated rehab goals.  ?????     Scot Jun, PTA 02/02/2019, 9:46 AM  [place], [place], [address] Phone: [phone]   Fax:  [phone]  Name: GARHETT BERNHARD MRN: [id number] Date of Birth: 1960-12-29

## 2019-04-05 ENCOUNTER — Other Ambulatory Visit: Payer: Self-pay | Admitting: Urology

## 2019-04-06 ENCOUNTER — Other Ambulatory Visit: Payer: Self-pay | Admitting: Urology

## 2019-04-06 MED ORDER — CEPHALEXIN 250 MG PO CAPS: 500.0000 mg | ORAL_CAPSULE | Freq: Once | ORAL | Status: AC

## 2019-04-09 ENCOUNTER — Ambulatory Visit (INDEPENDENT_AMBULATORY_CARE_PROVIDER_SITE_OTHER)
Admission: RE | Admit: 2019-04-09 | Discharge: 2019-04-09 | Disposition: A | Discharge status: Home / Self Care | Payer: 59 | Source: Ambulatory Visit | Attending: Acute Care | Admitting: Acute Care

## 2019-04-09 ENCOUNTER — Other Ambulatory Visit: Payer: Self-pay

## 2019-04-09 DIAGNOSIS — Z87891 Personal history of nicotine dependence: Secondary | ICD-10-CM | POA: Diagnosis not present

## 2019-04-09 DIAGNOSIS — Z122 Encounter for screening for malignant neoplasm of respiratory organs: Secondary | ICD-10-CM

## 2019-04-12 ENCOUNTER — Other Ambulatory Visit: Payer: Self-pay | Admitting: *Deleted

## 2019-04-12 DIAGNOSIS — Z87891 Personal history of nicotine dependence: Secondary | ICD-10-CM

## 2019-04-12 DIAGNOSIS — Z122 Encounter for screening for malignant neoplasm of respiratory organs: Secondary | ICD-10-CM

## 2019-05-15 ENCOUNTER — Ambulatory Visit (HOSPITAL_COMMUNITY)
Admission: RE | Admit: 2019-05-15 | Discharge: 2019-05-15 | Disposition: A | Discharge status: Home / Self Care | Payer: 59 | Source: Ambulatory Visit | Attending: Urology | Admitting: Urology

## 2019-05-15 ENCOUNTER — Other Ambulatory Visit (HOSPITAL_COMMUNITY): Payer: Self-pay | Admitting: Urology

## 2019-05-15 ENCOUNTER — Other Ambulatory Visit: Payer: Self-pay

## 2019-05-15 DIAGNOSIS — N201 Calculus of ureter: Secondary | ICD-10-CM

## 2019-05-17 ENCOUNTER — Encounter (HOSPITAL_COMMUNITY)
Admission: RE | Disposition: A | Discharge status: Home / Self Care | Payer: Self-pay | Source: Home / Self Care | Attending: Urology

## 2019-05-17 ENCOUNTER — Ambulatory Visit (HOSPITAL_COMMUNITY)
Admission: RE | Admit: 2019-05-17 | Discharge: 2019-05-17 | Disposition: A | Discharge status: Home / Self Care | Payer: 59 | Attending: Urology | Admitting: Urology

## 2019-05-17 ENCOUNTER — Encounter (HOSPITAL_COMMUNITY): Payer: Self-pay | Admitting: General Practice

## 2019-05-17 ENCOUNTER — Ambulatory Visit (HOSPITAL_COMMUNITY): Payer: 59

## 2019-05-17 DIAGNOSIS — I10 Essential (primary) hypertension: Secondary | ICD-10-CM | POA: Diagnosis not present

## 2019-05-17 DIAGNOSIS — Z7982 Long term (current) use of aspirin: Secondary | ICD-10-CM | POA: Insufficient documentation

## 2019-05-17 DIAGNOSIS — Z881 Allergy status to other antibiotic agents status: Secondary | ICD-10-CM | POA: Diagnosis not present

## 2019-05-17 DIAGNOSIS — N200 Calculus of kidney: Secondary | ICD-10-CM | POA: Insufficient documentation

## 2019-05-17 DIAGNOSIS — Z79899 Other long term (current) drug therapy: Secondary | ICD-10-CM | POA: Diagnosis not present

## 2019-05-17 DIAGNOSIS — Z888 Allergy status to other drugs, medicaments and biological substances status: Secondary | ICD-10-CM | POA: Diagnosis not present

## 2019-05-17 HISTORY — PX: EXTRACORPOREAL SHOCK WAVE LITHOTRIPSY: SHX1557

## 2019-05-17 SURGERY — LITHOTRIPSY, ESWL
Anesthesia: LOCAL | Laterality: Left

## 2019-05-17 MED ORDER — ONDANSETRON HCL 4 MG PO TABS: 4.0000 mg | ORAL_TABLET | Freq: Every day | ORAL | 1 refills | Status: DC | PRN

## 2019-05-17 MED ORDER — TAMSULOSIN HCL 0.4 MG PO CAPS: 0.4000 mg | ORAL_CAPSULE | Freq: Every day | ORAL | 0 refills | Status: DC

## 2019-05-17 MED ORDER — HYDROCODONE-ACETAMINOPHEN 5-325 MG PO TABS: 1.0000 | ORAL_TABLET | ORAL | 0 refills | Status: DC | PRN

## 2019-05-17 MED ORDER — DIAZEPAM 5 MG PO TABS
10.0000 mg | ORAL_TABLET | ORAL | Status: AC
  Administered 2019-05-17: 06:00:00 10 mg via ORAL
  Filled 2019-05-17: qty 2

## 2019-05-17 MED ORDER — SODIUM CHLORIDE 0.9 % IV SOLN
INTRAVENOUS | Status: DC
  Administered 2019-05-17: 06:00:00 via INTRAVENOUS

## 2019-05-17 MED ORDER — CEPHALEXIN 500 MG PO CAPS
500.0000 mg | ORAL_CAPSULE | ORAL | Status: AC
  Administered 2019-05-17: 500 mg via ORAL
  Filled 2019-05-17: qty 1

## 2019-05-17 MED ORDER — DIPHENHYDRAMINE HCL 25 MG PO CAPS
25.0000 mg | ORAL_CAPSULE | ORAL | Status: AC
  Administered 2019-05-17: 06:00:00 25 mg via ORAL
  Filled 2019-05-17: qty 1

## 2019-05-17 NOTE — Op Note (Signed)
[facility] Operative Note  Treating Physician: Ellison Hughs, MD  Pre-op diagnosis: 6 mm left upper pole and 10 mm left lower pole renal stones  Post-op diagnosis: Same   Procedure: LEFT ESWL  See Aris Everts OP note scanned into chart. Also because of the size, density, location and other factors that cannot be anticipated I feel this will likely be a staged procedure. This fact supersedes any indication in the scanned [place] stone operative note to the contrary

## 2019-05-17 NOTE — Progress Notes (Signed)
Teachin done with patient at [phone]

## 2019-05-17 NOTE — H&P (Signed)
Urology Preoperative H&P   Chief Complaint: Kidney stones  History of Present Illness: Steven Henson is a 58 y.o. male with a history of kidney stones.  His most recent KUBs have shown bilateral renal stones.  Today, he denies flank pain dysuria, hematuria or interval stone passage.  He is here for treatment of his left sided stone burden.     Past Medical History:  Diagnosis Date  . Arthritis    lower back  . Coronary artery disease   . Family history of adverse reaction to anesthesia    "my mother had a hard waking up from anesthesia"  . H/O echocardiogram 12/04/1997   Normal LV systolic function. No valvular abnormalities.  . H/O myocardial perfusion scan 05/20/2009   The post stress myocardial perfusion images show a normal pattern of perfusion in all regions. the post stress left ventricle is normal in size. no significant wall motion abnormalities noted. Normal myocardial perfusion study. this is a low risk scan.  Marland Kitchen History of kidney stones    "lots"  . Hyperlipidemia   . Hypertension   . Kidney stones   . Lipoma    on back  . Sleep apnea    no CPAP use - denies apnea, waking up gasping/choking, denies daytime sleepiness    Past Surgical History:  Procedure Laterality Date  . BACK SURGERY     Micro surgery at [facility] 2012?  . CARDIAC CATHETERIZATION  > 10 yrs.  . CYSTOSCOPY/RETROGRADE/URETEROSCOPY/STONE EXTRACTION WITH BASKET  1/02, 12/01  . FRACTURE SURGERY  1974   right leg, both humerus, right wrist  . KIDNEY STONE SURGERY  2006  . LIPOMA EXCISION  05/14/2011   Procedure: EXCISION LIPOMA;  Surgeon: Belva Crome, MD;  Location: [place];  Service: General;  Laterality: Right;  EXCISION RIGHT UPPER BACK LIPOMA  . ORIF FINGER / THUMB FRACTURE  2002   right  . SHOULDER ARTHROSCOPY W/ SUBACROMIAL DECOMPRESSION AND DISTAL CLAVICLE EXCISION  7/08   left    Allergies:  Allergies  Allergen Reactions  . Ciprofloxacin Diarrhea and Nausea And  Vomiting    Hypotension  . Benzoin Hives  . Lactated Ringers Other (See Comments)    Really bad muscle spasms  . Tincture Of Benzoin [Benzoin Compound] Rash    Family History  Problem Relation Age of Onset  . Anesthesia problems Mother        effects lasted for days  . Heart disease Mother   . Heart disease Father   . Heart disease Sister   . Lung cancer Maternal Grandfather   . Heart disease Paternal Grandmother   . Heart disease Paternal Grandfather   . Heart failure Paternal Grandfather   . COPD Paternal Grandfather   . Parkinson's disease Maternal Grandmother     Social History:  reports that he has been smoking cigars. He has smoked for the past 38.00 years. He has never used smokeless tobacco. He reports current alcohol use of about 2.0 standard drinks of alcohol per week. He reports that he does not use drugs.  ROS: A complete review of systems was performed.  All systems are negative except for pertinent findings as noted.  Physical Exam:  Vital signs in last 24 hours: Temp:  [98.2 F (36.8 C)] 98.2 F (36.8 C) (11/12 0555) Pulse Rate:  [87] 87 (11/12 0555) Resp:  [18] 18 (11/12 0555) BP: (127)/(93) 127/93 (11/12 0555) SpO2:  [99 %] 99 % (11/12 0555) Weight:  [95.9 kg]  95.9 kg (11/12 0555) Constitutional:  Alert and oriented, No acute distress Cardiovascular: Regular rate and rhythm, No JVD Respiratory: Normal respiratory effort, Lungs clear bilaterally GI: Abdomen is soft, nontender, nondistended, no abdominal masses GU: No CVA tenderness Lymphatic: No lymphadenopathy Neurologic: Grossly intact, no focal deficits Psychiatric: Normal mood and affect  Laboratory Data:  No results for input(s): WBC, HGB, HCT, PLT in the last 72 hours.  No results for input(s): NA, K, CL, GLUCOSE, BUN, CALCIUM, CREATININE in the last 72 hours.  Invalid input(s): CO3   No results found for this or any previous visit (from the past 24 hour(s)). No results found for this or  any previous visit (from the past 240 hour(s)).  Renal Function: No results for input(s): CREATININE in the last 168 hours. CrCl cannot be calculated (Patient's most recent lab result is older than the maximum 21 days allowed.).  Radiologic Imaging: Dg Abd 1 View  Result Date: 05/17/2019 CLINICAL DATA:  Preoperative exam for lithotripsy. EXAM: ABDOMEN - 1 VIEW COMPARISON:  05/15/2019 FINDINGS: Bilateral nephrolithiasis again noted without interim change. Pelvic calcifications noted most likely vascular calcifications, these appear unchanged. No bowel distention. No acute bony abnormality. Prior lower lumbar spine fusion. IMPRESSION: Bilateral nephrolithiasis again noted without interim change. Electronically Signed   By: Marcello Moores  Register   On: 05/17/2019 07:10   Dg Abd 1 View  Result Date: 05/15/2019 CLINICAL DATA:  Nephrolithiasis. EXAM: ABDOMEN - 1 VIEW COMPARISON:  March 22, 2019. FINDINGS: The bowel gas pattern is normal. Stable bilateral nephrolithiasis is noted, with largest calculus measuring 11 mm over lower pole of left kidney. No definite ureteral calculus is noted. IMPRESSION: Stable bilateral nephrolithiasis. Electronically Signed   By: Marijo Conception M.D.   On: 05/15/2019 13:40    I independently reviewed the above imaging studies.  Assessment and Plan Steven Henson is a 58 y.o. male with bilateral renal stones  The risks, benefits and alternatives of LEFT ESWL was discussed with the patient. I described the risks which include arrhythmia, kidney contusion, kidney hemorrhage, need for transfusion, back discomfort, flank ecchymosis, flank abrasion, inability to fracture the stone, inability to pass stone fragments, Steinstrasse, infection associated with obstructing stones, need for an alternative surgical procedure and possible need for repeat shockwave lithotripsy.  The patient voices understanding and wishes to proceed.    Ellison Hughs, MD 05/17/2019, 7:37 AM   [facility] Pager: [phone]

## 2019-05-17 NOTE — Discharge Instructions (Signed)
Dietary Guidelines to Help Prevent Kidney Stones Kidney stones are deposits of minerals and salts that form inside your kidneys. Your risk of developing kidney stones may be greater depending on your diet, your lifestyle, the medicines you take, and whether you have certain medical conditions. Most people can reduce their chances of developing kidney stones by following the instructions below. Depending on your overall health and the type of kidney stones you tend to develop, your dietitian may give you more specific instructions. What are tips for following this plan? Reading food labels  Choose foods with "no salt added" or "low-salt" labels. Limit your sodium intake to less than 1500 mg per day.  Choose foods with calcium for each meal and snack. Try to eat about 300 mg of calcium at each meal. Foods that contain 200-500 mg of calcium per serving include: ? 8 oz (237 ml) of milk, fortified nondairy milk, and fortified fruit juice. ? 8 oz (237 ml) of kefir, yogurt, and soy yogurt. ? 4 oz (118 ml) of tofu. ? 1 oz of cheese. ? 1 cup (300 g) of dried figs. ? 1 cup (91 g) of cooked broccoli. ? 1-3 oz can of sardines or mackerel.  Most people need 1000 to 1500 mg of calcium each day. Talk to your dietitian about how much calcium is recommended for you. Shopping  Buy plenty of fresh fruits and vegetables. Most people do not need to avoid fruits and vegetables, even if they contain nutrients that may contribute to kidney stones.  When shopping for convenience foods, choose: ? Whole pieces of fruit. ? Premade salads with dressing on the side. ? Low-fat fruit and yogurt smoothies.  Avoid buying frozen meals or prepared deli foods.  Look for foods with live cultures, such as yogurt and kefir. Cooking  Do not add salt to food when cooking. Place a salt shaker on the table and allow each person to add his or her own salt to taste.  Use vegetable protein, such as beans, textured vegetable  protein (TVP), or tofu instead of meat in pasta, casseroles, and soups. Meal planning   Eat less salt, if told by your dietitian. To do this: ? Avoid eating processed or premade food. ? Avoid eating fast food.  Eat less animal protein, including cheese, meat, poultry, or fish, if told by your dietitian. To do this: ? Limit the number of times you have meat, poultry, fish, or cheese each week. Eat a diet free of meat at least 2 days a week. ? Eat only one serving each day of meat, poultry, fish, or seafood. ? When you prepare animal protein, cut pieces into small portion sizes. For most meat and fish, one serving is about the size of one deck of cards.  Eat at least 5 servings of fresh fruits and vegetables each day. To do this: ? Keep fruits and vegetables on hand for snacks. ? Eat 1 piece of fruit or a handful of berries with breakfast. ? Have a salad and fruit at lunch. ? Have two kinds of vegetables at dinner.  Limit foods that are high in a substance called oxalate. These include: ? Spinach. ? Rhubarb. ? Beets. ? Potato chips and french fries. ? Nuts.  If you regularly take a diuretic medicine, make sure to eat at least 1-2 fruits or vegetables high in potassium each day. These include: ? Avocado. ? Banana. ? Orange, prune, carrot, or tomato juice. ? Baked potato. ? Cabbage. ? Beans and split   peas. General instructions   Drink enough fluid to keep your urine clear or pale yellow. This is the most important thing you can do.  Talk to your health care provider and dietitian about taking daily supplements. Depending on your health and the cause of your kidney stones, you may be advised: ? Not to take supplements with vitamin C. ? To take a calcium supplement. ? To take a daily probiotic supplement. ? To take other supplements such as magnesium, fish oil, or vitamin B6.  Take all medicines and supplements as told by your health care provider.  Limit alcohol intake to no  more than 1 drink a day for nonpregnant women and 2 drinks a day for men. One drink equals 12 oz of beer, 5 oz of wine, or 1 oz of hard liquor.  Lose weight if told by your health care provider. Work with your dietitian to find strategies and an eating plan that works best for you. What foods are not recommended? Limit your intake of the following foods, or as told by your dietitian. Talk to your dietitian about specific foods you should avoid based on the type of kidney stones and your overall health. Grains Breads. Bagels. Rolls. Baked goods. Salted crackers. Cereal. Pasta. Vegetables Spinach. Rhubarb. Beets. Canned vegetables. Pickles. Olives. Meats and other protein foods Nuts. Nut butters. Large portions of meat, poultry, or fish. Salted or cured meats. Deli meats. Hot dogs. Sausages. Dairy Cheese. Beverages Regular soft drinks. Regular vegetable juice. Seasonings and other foods Seasoning blends with salt. Salad dressings. Canned soups. Soy sauce. Ketchup. Barbecue sauce. Canned pasta sauce. Casseroles. Pizza. Lasagna. Frozen meals. Potato chips. French fries. Summary  You can reduce your risk of kidney stones by making changes to your diet.  The most important thing you can do is drink enough fluid. You should drink enough fluid to keep your urine clear or pale yellow.  Ask your health care provider or dietitian how much protein from animal sources you should eat each day, and also how much salt and calcium you should have each day. This information is not intended to replace advice given to you by your health care provider. Make sure you discuss any questions you have with your health care provider. Document Released: 10/16/2010 Document Revised: 10/11/2018 Document Reviewed: 06/01/2016 Elsevier Patient Education  2020 Elsevier Inc.  

## 2019-05-18 ENCOUNTER — Encounter (HOSPITAL_COMMUNITY): Payer: Self-pay | Admitting: Urology

## 2020-02-05 ENCOUNTER — Other Ambulatory Visit: Payer: Self-pay | Admitting: Urology

## 2020-02-14 ENCOUNTER — Encounter: Payer: Self-pay | Admitting: Cardiovascular Disease

## 2020-02-14 NOTE — Progress Notes (Signed)
Cardiology Office Note:    Date:  02/15/2020   ID:  Steven Henson, DOB 26-Sep-1960, MRN [id number]  PCP:  Lujean Amel, MD  [facility] Cardiologist: previous Minta Balsam, MD , now  Mertie Moores, MD  [place] Electrophysiologist:  None   Referring MD: Lujean Amel, MD   Chief Complaint  Patient presents with  . Coronary Artery Disease    History of Present Illness:    Steven Henson is a 59 y.o. male with a hx of nonobstructive coronary artery disease, hyperlipidemia, premature atrial contractions, cigarette smoking.  He is previously seen by Dr. Aundra Dubin  and was last seen by Dr. Saunders Revel.  He is here today to establish care with me.  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  No CP   Has had 2 covid vaccines.   Lipids are managed by his primary   Past Medical History:  Diagnosis Date  . Arthritis    lower back  . Coronary artery disease   . Family history of adverse reaction to anesthesia    "my mother had a hard waking up from anesthesia"  . H/O echocardiogram 12/04/1997   Normal LV systolic function. No valvular abnormalities.  . H/O myocardial perfusion scan 05/20/2009   The post stress myocardial perfusion images show a normal pattern of perfusion in all regions. the post stress left ventricle is normal in size. no significant wall motion abnormalities noted. Normal myocardial perfusion study. this is a low risk scan.  Marland Kitchen History of kidney stones    "lots"  . Hyperlipidemia   . Hypertension   . Kidney stones   . Lipoma    on back  . Sleep apnea    no CPAP use - denies apnea, waking up gasping/choking, denies daytime sleepiness    Past Surgical History:  Procedure Laterality Date  . BACK SURGERY     Micro surgery at [facility] 2012?  . CARDIAC CATHETERIZATION  > 10 yrs.  . CYSTOSCOPY/RETROGRADE/URETEROSCOPY/STONE EXTRACTION WITH  BASKET  1/02, 12/01  . EXTRACORPOREAL SHOCK WAVE LITHOTRIPSY Left 05/17/2019   Procedure: EXTRACORPOREAL SHOCK WAVE LITHOTRIPSY ([facility]);  Surgeon: Ceasar Mons, MD;  Location: [facility];  Service: Urology;  Laterality: Left;  . FRACTURE SURGERY  1974   right leg, both humerus, right wrist  . KIDNEY STONE SURGERY  2006  . LIPOMA EXCISION  05/14/2011   Procedure: EXCISION LIPOMA;  Surgeon: Belva Crome, MD;  Location: [place];  Service: General;  Laterality: Right;  EXCISION RIGHT UPPER BACK LIPOMA  . ORIF FINGER / THUMB FRACTURE  2002   right  . SHOULDER ARTHROSCOPY W/ SUBACROMIAL DECOMPRESSION AND DISTAL CLAVICLE EXCISION  7/08   left    Current Medications: Current Meds  Medication Sig  . acetaminophen (TYLENOL) 500 MG tablet Take 1,000 mg by mouth every 6 (six) hours as needed for moderate pain.  Marland Kitchen amLODipine (NORVASC) 5 MG tablet Take 5 mg by mouth daily.  Marland Kitchen aspirin EC 81 MG tablet Take 81 mg by mouth daily.  Marland Kitchen atorvastatin (LIPITOR) 40 MG tablet Take 1 tablet by mouth at  bedtime  . fish oil-omega-3 fatty acids 1000 MG capsule Take 2,000 mg by mouth 2 (two) times daily.   . [DISCONTINUED] cyclobenzaprine (FLEXERIL) 10 MG tablet Take 1 tablet (10 mg total) by mouth 3 (three) times daily  as needed for muscle spasms.     Allergies:   Ciprofloxacin, Benzoin, Lactated ringers, and Tincture of benzoin [benzoin compound]   Social History   Socioeconomic History  . Marital status: Married    Spouse name: Not on file  . Number of children: 2  . Years of education: Not on file  . Highest education level: Not on file  Occupational History  . Occupation: Customer service manager: [employer]  Tobacco Use  . Smoking status: Light Tobacco Smoker    Years: 38.00    Types: Cigars    Last attempt to quit: 2009    Years since quitting: 12.6  . Smokeless tobacco: Never Used  . Tobacco comment: maybe 2 cigars weekly  Vaping Use  . Vaping Use: Never used    Substance and Sexual Activity  . Alcohol use: Yes    Alcohol/week: 2.0 standard drinks    Types: 2 Shots of liquor per week  . Drug use: No  . Sexual activity: Yes  Other Topics Concern  . Not on file  Social History Narrative  . Not on file   Social Determinants of Health   Financial Resource Strain:   . Difficulty of Paying Living Expenses:   Food Insecurity:   . Worried About Charity fundraiser in the Last Year:   . Arboriculturist in the Last Year:   Transportation Needs:   . Film/video editor (Medical):   Marland Kitchen Lack of Transportation (Non-Medical):   Physical Activity:   . Days of Exercise per Week:   . Minutes of Exercise per Session:   Stress:   . Feeling of Stress :   Social Connections:   . Frequency of Communication with Friends and Family:   . Frequency of Social Gatherings with Friends and Family:   . Attends Religious Services:   . Active Member of Clubs or Organizations:   . Attends Archivist Meetings:   Marland Kitchen Marital Status:      Family History: The patient's family history includes Anesthesia problems in his mother; COPD in his paternal grandfather; Heart disease in his father, mother, paternal grandfather, paternal grandmother, and sister; Heart failure in his paternal grandfather; Lung cancer in his maternal grandfather; Parkinson's disease in his maternal grandmother.  ROS:   Please see the history of present illness.     All other systems reviewed and are negative.   EKGs/Labs/Other Studies Reviewed:    The following studies were reviewed today:     Recent Labs: No results found for requested labs within last 8760 hours.  Recent Lipid Panel    Component Value Date/Time   CHOL 162 08/14/2014 0807   TRIG 174.0 (H) 08/14/2014 0807   HDL 52.00 08/14/2014 0807   CHOLHDL 3 08/14/2014 0807   VLDL 34.8 08/14/2014 0807   LDLCALC 75 08/14/2014 0807    Physical Exam:    VS:  BP 122/74   Pulse 77   Ht 5\' 10"  (1.778 m)   Wt 201 lb  6.4 oz (91.4 kg)   SpO2 96%   BMI 28.90 kg/m     Wt Readings from Last 3 Encounters:  02/15/20 201 lb 6.4 oz (91.4 kg)  05/17/19 211 lb 6.4 oz (95.9 kg)  08/28/18 215 lb (97.5 kg)     GEN:  Well nourished, well developed in no acute distress HEENT: Normal NECK: No JVD; No carotid bruits LYMPHATICS: No lymphadenopathy CARDIAC: RRR, no murmurs, rubs, gallops RESPIRATORY:  Clear  to auscultation without rales, wheezing or rhonchi  ABDOMEN: Soft, non-tender, non-distended MUSCULOSKELETAL:  No edema; No deformity  SKIN: Warm and dry NEUROLOGIC:  Alert and oriented x 3 PSYCHIATRIC:  Normal affect   EKG:   Aug. 13, 2021:   NSR at 77,  No ST or T wave abn.   ASSESSMENT:    1. Coronary artery disease involving native coronary artery of native heart without angina pectoris    PLAN:    In order of problems listed above:  1. Hypertension: His blood pressure is well controlled.  Continue amlodipine 5 mg a day.  I have cautioned him that he may develop some mild leg edema if he goes up to 10 mg a day.  For now he is doing well on the current dose of 5 mg a day.   2. Hyperlipidemia: Continue atorvastatin 40 mg a day.  This is being managed by his primary medical doctor.  His last lipid levels were reviewed on the KP in sheet.  His LDL is in the 90s.  He will get those rechecked again next month.  3.  Coronary artery disease: He has very mild coronary artery disease.  Continue aspirin.  Continue with atorvastatin.  He has stopped smoking.  Because I am now part-time we will transfer his care  to Dr. Gayla Doss n at the [facility]  office.   Medication Adjustments/Labs and Tests Ordered: Current medicines are reviewed at length with the patient today.  Concerns regarding medicines are outlined above.  Orders Placed This Encounter  Procedures  . EKG 12-Lead   No orders of the defined types were placed in this encounter.   Patient Instructions  Medication Instructions:  Your  provider recommends that you continue on your current medications as directed. Please refer to the Current Medication list given to you today.   *If you need a refill on your cardiac medications before your next appointment, please call your pharmacy*  Follow-Up: At [facility], you and your health needs are our priority.  As part of our continuing mission to provide you with exceptional heart care, we have created designated Provider Care Teams.  These Care Teams include your primary Cardiologist (physician) and Advanced Practice Providers (APPs -  Physician Assistants and Nurse Practitioners) who all work together to provide you with the care you need, when you need it. Your next appointment:   12 month(s) The format for your next appointment:   In Person Provider:   You may see Dr. Gayla Doss or one of the following Advanced Practice Providers on your designated Care Team:    Rosaria Ferries, PA-C  Jory Sims, DNP, ANP  Cadence Kathlen Mody, PA-C      Signed, Mertie Moores, MD  02/15/2020 8:49 AM    [place]

## 2020-02-15 ENCOUNTER — Other Ambulatory Visit: Payer: Self-pay

## 2020-02-15 ENCOUNTER — Ambulatory Visit: Payer: 59 | Admitting: Cardiovascular Disease

## 2020-02-15 ENCOUNTER — Encounter: Payer: Self-pay | Admitting: Cardiovascular Disease

## 2020-02-15 VITALS — BP 122/74 | HR 77 | Ht 70.0 in | Wt 201.4 lb

## 2020-02-15 DIAGNOSIS — I2510 Atherosclerotic heart disease of native coronary artery without angina pectoris: Secondary | ICD-10-CM

## 2020-02-15 DIAGNOSIS — E782 Mixed hyperlipidemia: Secondary | ICD-10-CM

## 2020-02-15 DIAGNOSIS — I10 Essential (primary) hypertension: Secondary | ICD-10-CM

## 2020-02-15 NOTE — Patient Instructions (Signed)
Medication Instructions:  Your provider recommends that you continue on your current medications as directed. Please refer to the Current Medication list given to you today.   *If you need a refill on your cardiac medications before your next appointment, please call your pharmacy*  Follow-Up: At [facility], you and your health needs are our priority.  As part of our continuing mission to provide you with exceptional heart care, we have created designated Provider Care Teams.  These Care Teams include your primary Cardiologist (physician) and Advanced Practice Providers (APPs -  Physician Assistants and Nurse Practitioners) who all work together to provide you with the care you need, when you need it. Your next appointment:   12 month(s) The format for your next appointment:   In Person Provider:   You may see Dr. Gayla Doss or one of the following Advanced Practice Providers on your designated Care Team:    Rosaria Ferries, PA-C  Jory Sims, DNP, ANP  Cadence Kathlen Mody, PA-C

## 2020-03-11 ENCOUNTER — Encounter (INDEPENDENT_AMBULATORY_CARE_PROVIDER_SITE_OTHER): Payer: Self-pay

## 2020-03-11 ENCOUNTER — Other Ambulatory Visit (HOSPITAL_COMMUNITY)
Admission: RE | Admit: 2020-03-11 | Discharge: 2020-03-11 | Disposition: A | Discharge status: Home / Self Care | Payer: 59 | Source: Ambulatory Visit | Attending: Urology | Admitting: Urology

## 2020-03-11 DIAGNOSIS — Z20822 Contact with and (suspected) exposure to covid-19: Secondary | ICD-10-CM | POA: Insufficient documentation

## 2020-03-11 DIAGNOSIS — Z01812 Encounter for preprocedural laboratory examination: Secondary | ICD-10-CM | POA: Insufficient documentation

## 2020-03-11 LAB — SARS CORONAVIRUS 2 (TAT 6-24 HRS): SARS Coronavirus 2: NEGATIVE

## 2020-03-11 MED ORDER — DIAZEPAM 5 MG PO TABS: 10.0000 mg | ORAL_TABLET | ORAL | Status: DC

## 2020-03-11 MED ORDER — DIPHENHYDRAMINE HCL 25 MG PO CAPS: 25.0000 mg | ORAL_CAPSULE | ORAL | Status: DC

## 2020-03-11 MED ORDER — SODIUM CHLORIDE 0.9 % IV SOLN: INTRAVENOUS | Status: DC

## 2020-03-11 MED ORDER — CIPROFLOXACIN HCL 500 MG PO TABS: 500.0000 mg | ORAL_TABLET | ORAL | Status: DC

## 2020-03-13 ENCOUNTER — Other Ambulatory Visit: Payer: Self-pay

## 2020-03-13 ENCOUNTER — Ambulatory Visit (HOSPITAL_BASED_OUTPATIENT_CLINIC_OR_DEPARTMENT_OTHER)
Admission: RE | Admit: 2020-03-13 | Discharge: 2020-03-13 | Disposition: A | Discharge status: Home / Self Care | Payer: 59 | Attending: Urology | Admitting: Urology

## 2020-03-13 ENCOUNTER — Ambulatory Visit (HOSPITAL_COMMUNITY): Payer: 59

## 2020-03-13 ENCOUNTER — Encounter (HOSPITAL_BASED_OUTPATIENT_CLINIC_OR_DEPARTMENT_OTHER): Payer: Self-pay | Admitting: Urology

## 2020-03-13 ENCOUNTER — Encounter (HOSPITAL_BASED_OUTPATIENT_CLINIC_OR_DEPARTMENT_OTHER)
Admission: RE | Disposition: A | Discharge status: Home / Self Care | Payer: Self-pay | Source: Home / Self Care | Attending: Urology

## 2020-03-13 DIAGNOSIS — Z823 Family history of stroke: Secondary | ICD-10-CM | POA: Diagnosis not present

## 2020-03-13 DIAGNOSIS — Z87891 Personal history of nicotine dependence: Secondary | ICD-10-CM | POA: Insufficient documentation

## 2020-03-13 DIAGNOSIS — Z7982 Long term (current) use of aspirin: Secondary | ICD-10-CM | POA: Insufficient documentation

## 2020-03-13 DIAGNOSIS — Z820 Family history of epilepsy and other diseases of the nervous system: Secondary | ICD-10-CM | POA: Insufficient documentation

## 2020-03-13 DIAGNOSIS — Z825 Family history of asthma and other chronic lower respiratory diseases: Secondary | ICD-10-CM | POA: Insufficient documentation

## 2020-03-13 DIAGNOSIS — I10 Essential (primary) hypertension: Secondary | ICD-10-CM | POA: Insufficient documentation

## 2020-03-13 DIAGNOSIS — M1990 Unspecified osteoarthritis, unspecified site: Secondary | ICD-10-CM | POA: Insufficient documentation

## 2020-03-13 DIAGNOSIS — Z8249 Family history of ischemic heart disease and other diseases of the circulatory system: Secondary | ICD-10-CM | POA: Diagnosis not present

## 2020-03-13 DIAGNOSIS — Z79899 Other long term (current) drug therapy: Secondary | ICD-10-CM | POA: Insufficient documentation

## 2020-03-13 DIAGNOSIS — Z981 Arthrodesis status: Secondary | ICD-10-CM | POA: Insufficient documentation

## 2020-03-13 DIAGNOSIS — N200 Calculus of kidney: Secondary | ICD-10-CM | POA: Insufficient documentation

## 2020-03-13 DIAGNOSIS — G4730 Sleep apnea, unspecified: Secondary | ICD-10-CM | POA: Insufficient documentation

## 2020-03-13 DIAGNOSIS — Z87442 Personal history of urinary calculi: Secondary | ICD-10-CM | POA: Insufficient documentation

## 2020-03-13 DIAGNOSIS — E7800 Pure hypercholesterolemia, unspecified: Secondary | ICD-10-CM | POA: Insufficient documentation

## 2020-03-13 HISTORY — PX: EXTRACORPOREAL SHOCK WAVE LITHOTRIPSY: SHX1557

## 2020-03-13 SURGERY — LITHOTRIPSY, ESWL
Anesthesia: LOCAL | Laterality: Right

## 2020-03-13 MED ORDER — CEFAZOLIN SODIUM-DEXTROSE 2-4 GM/100ML-% IV SOLN
2.0000 g | Freq: Once | INTRAVENOUS | Status: AC
  Administered 2020-03-13: 2 g via INTRAVENOUS

## 2020-03-13 MED ORDER — HYDROCODONE-ACETAMINOPHEN 5-325 MG PO TABS
1.0000 | ORAL_TABLET | ORAL | 0 refills | Status: DC | PRN
Start: 2020-03-13 — End: 2021-02-16

## 2020-03-13 MED ORDER — CEFAZOLIN SODIUM-DEXTROSE 2-4 GM/100ML-% IV SOLN
INTRAVENOUS | Status: AC
  Filled 2020-03-13: qty 100

## 2020-03-13 MED ORDER — DIAZEPAM 5 MG PO TABS
10.0000 mg | ORAL_TABLET | ORAL | Status: AC
  Administered 2020-03-13: 10 mg via ORAL

## 2020-03-13 MED ORDER — TAMSULOSIN HCL 0.4 MG PO CAPS: 0.4000 mg | ORAL_CAPSULE | Freq: Every day | ORAL | 0 refills | Status: DC

## 2020-03-13 MED ORDER — SODIUM CHLORIDE 0.9 % IV SOLN: INTRAVENOUS | Status: DC

## 2020-03-13 MED ORDER — DIPHENHYDRAMINE HCL 25 MG PO CAPS
25.0000 mg | ORAL_CAPSULE | ORAL | Status: AC
  Administered 2020-03-13: 25 mg via ORAL

## 2020-03-13 MED ORDER — DIPHENHYDRAMINE HCL 25 MG PO CAPS
ORAL_CAPSULE | ORAL | Status: AC
  Filled 2020-03-13: qty 1

## 2020-03-13 MED ORDER — DIAZEPAM 5 MG PO TABS
ORAL_TABLET | ORAL | Status: AC
  Filled 2020-03-13: qty 2

## 2020-03-13 NOTE — Op Note (Signed)
[facility] Operative Note  Treating Physician: Ellison Hughs, MD  Pre-op diagnosis: 8 mm right upper pole renal stone  Post-op diagnosis: Same   Procedure: RIGHT ESWL  See Aris Everts OP note scanned into chart. Also because of the size, density, location and other factors that cannot be anticipated I feel this will likely be a staged procedure. This fact supersedes any indication in the scanned [place] stone operative note to the contrary

## 2020-03-13 NOTE — H&P (Signed)
See scanned H&P from [facility]

## 2020-03-13 NOTE — Discharge Instructions (Signed)

## 2020-03-14 ENCOUNTER — Encounter (HOSPITAL_BASED_OUTPATIENT_CLINIC_OR_DEPARTMENT_OTHER): Payer: Self-pay | Admitting: Urology

## 2020-04-23 ENCOUNTER — Other Ambulatory Visit: Payer: Self-pay

## 2020-04-23 ENCOUNTER — Ambulatory Visit (INDEPENDENT_AMBULATORY_CARE_PROVIDER_SITE_OTHER)
Admission: RE | Admit: 2020-04-23 | Discharge: 2020-04-23 | Disposition: A | Discharge status: Home / Self Care | Payer: 59 | Source: Ambulatory Visit | Attending: Acute Care | Admitting: Acute Care

## 2020-04-23 DIAGNOSIS — Z122 Encounter for screening for malignant neoplasm of respiratory organs: Secondary | ICD-10-CM

## 2020-04-23 DIAGNOSIS — Z87891 Personal history of nicotine dependence: Secondary | ICD-10-CM

## 2020-05-01 NOTE — Progress Notes (Signed)
Please call patient and let them  know their  low dose Ct was read as a Lung RADS 1, negative study: no nodules or definitely benign nodules. Radiology recommendation is for a repeat LDCT in 12 months. .Please let them  know we will order and schedule their  annual screening scan for 04/2021. Please let them  know there was notation of CAD on their  scan.  Please remind the patient  that this is a non-gated exam therefore degree or severity of disease  cannot be determined. Please have them  follow up with their PCP regarding potential risk factor modification, dietary therapy or pharmacologic therapy if clinically indicated. Pt.  is  currently on statin therapy. Please place order for annual  screening scan for  04/2021 and fax results to PCP. Thanks so much.

## 2020-05-02 ENCOUNTER — Other Ambulatory Visit: Payer: Self-pay | Admitting: *Deleted

## 2020-05-02 DIAGNOSIS — Z87891 Personal history of nicotine dependence: Secondary | ICD-10-CM

## 2021-02-15 NOTE — Progress Notes (Signed)
Cardiology Office Note:    Date:  02/16/2021   ID:  Steven Henson, DOB 1960-10-21, MRN [id number]  PCP:  Lujean Amel, MD  Cardiologist:  Mertie Moores, MD  Electrophysiologist:  None   Referring MD: Lujean Amel, MD   Chief Complaint  Patient presents with   Coronary Artery Disease    History of Present Illness:    Steven Henson is a 60 y.o. male with a hx of nonobstructive CAD, former tobacco use, hyperlipidemia, hypertension who presents for follow-up.  He previously followed with Dr. Acie Fredrickson, last seen 02/15/2020.  Cath 12/1998 showed 30% ostial LAD stenosis.  Exercise Myoview on 04/2017 showed normal perfusion, EF 54%, below average exercise tolerance (5.8 METS).  Echocardiogram 05/21/2017 showed EF 55 to 60%, no significant valvular disease.    Since last clinic visit, he reports that he has been doing well.  He denies any chest pain, dyspnea, or palpitations.  He has history of vertigo and reports intermittent dizziness but denies any syncope.  Reports occasional left lower extremity edema.  Also reports persistent numbness in left leg.  Reports BP has been controlled when he checks it at home.  He plays pickleball 2-3 times per week and walks his dog about 1.5 miles daily.  Denies any exertional symptoms.    Past Medical History:  Diagnosis Date   Arthritis    lower back   Coronary artery disease    Family history of adverse reaction to anesthesia    "my mother had a hard waking up from anesthesia"   H/O echocardiogram 12/04/1997   Normal LV systolic function. No valvular abnormalities.   H/O myocardial perfusion scan 05/20/2009   The post stress myocardial perfusion images show a normal pattern of perfusion in all regions. the post stress left ventricle is normal in size. no significant wall motion abnormalities noted. Normal myocardial perfusion study. this is a low risk scan.   History of kidney stones    "lots"   Hyperlipidemia    Hypertension    Kidney  stones    Lipoma    on back   Sleep apnea    no CPAP use - denies apnea, waking up gasping/choking, denies daytime sleepiness    Past Surgical History:  Procedure Laterality Date   BACK SURGERY     Micro surgery at [facility] 2012?   CARDIAC CATHETERIZATION  > 10 yrs.   CYSTOSCOPY/RETROGRADE/URETEROSCOPY/STONE EXTRACTION WITH BASKET  1/02, 12/01   EXTRACORPOREAL SHOCK WAVE LITHOTRIPSY Left 05/17/2019   Procedure: EXTRACORPOREAL SHOCK WAVE LITHOTRIPSY (ESWL);  Surgeon: Ceasar Mons, MD;  Location: [facility];  Service: Urology;  Laterality: Left;   EXTRACORPOREAL SHOCK WAVE LITHOTRIPSY Right 03/13/2020   Procedure: EXTRACORPOREAL SHOCK WAVE LITHOTRIPSY (ESWL);  Surgeon: Ceasar Mons, MD;  Location: [facility];  Service: Urology;  Laterality: Right;   FRACTURE SURGERY  1974   right leg, both humerus, right wrist   KIDNEY STONE SURGERY  2006   LIPOMA EXCISION  05/14/2011   Procedure: EXCISION LIPOMA;  Surgeon: Belva Crome, MD;  Location: [place];  Service: General;  Laterality: Right;  EXCISION RIGHT UPPER BACK LIPOMA   ORIF FINGER / THUMB FRACTURE  2002   right   SHOULDER ARTHROSCOPY W/ SUBACROMIAL DECOMPRESSION AND DISTAL CLAVICLE EXCISION  7/08   left    Current Medications: No outpatient medications have been marked as taking for the 02/16/21 encounter (Office Visit) with Donato Heinz, MD.     Allergies:  Ciprofloxacin, Benzoin, Lactated ringers, and Tincture of benzoin [benzoin compound]   Social History   Socioeconomic History   Marital status: Married    Spouse name: Not on file   Number of children: 2   Years of education: Not on file   Highest education level: Not on file  Occupational History   Occupation: Paramedic    Employer: [employer]  Tobacco Use   Smoking status: Light Smoker    Types: Cigars    Last attempt to quit: 2009    Years since quitting: 13.6   Smokeless tobacco: Never    Tobacco comments:    maybe 2 cigars weekly  Vaping Use   Vaping Use: Never used  Substance and Sexual Activity   Alcohol use: Yes    Alcohol/week: 2.0 standard drinks    Types: 2 Shots of liquor per week   Drug use: No   Sexual activity: Yes  Other Topics Concern   Not on file  Social History Narrative   Not on file   Social Determinants of Health   Financial Resource Strain: Not on file  Food Insecurity: Not on file  Transportation Needs: Not on file  Physical Activity: Not on file  Stress: Not on file  Social Connections: Not on file     Family History: The patient's family history includes Anesthesia problems in his mother; COPD in his paternal grandfather; Heart disease in his father, mother, paternal grandfather, paternal grandmother, and sister; Heart failure in his paternal grandfather; Lung cancer in his maternal grandfather; Parkinson's disease in his maternal grandmother.  ROS:   Please see the history of present illness.     All other systems reviewed and are negative.  EKGs/Labs/Other Studies Reviewed:    The following studies were reviewed today:   EKG:  EKG is  ordered today.  The ekg ordered today demonstrates NSR, rate 72, no ST abnormalities  Recent Labs: No results found for requested labs within last 8760 hours.  Recent Lipid Panel    Component Value Date/Time   CHOL 162 08/14/2014 0807   TRIG 174.0 (H) 08/14/2014 0807   HDL 52.00 08/14/2014 0807   CHOLHDL 3 08/14/2014 0807   VLDL 34.8 08/14/2014 0807   LDLCALC 75 08/14/2014 0807    Physical Exam:    VS:  BP 134/90   Pulse 72   Ht '5\' 10"'$  (1.778 m)   Wt 216 lb 9.6 oz (98.2 kg)   SpO2 98%   BMI 31.08 kg/m     Wt Readings from Last 3 Encounters:  02/16/21 216 lb 9.6 oz (98.2 kg)  03/13/20 198 lb (89.8 kg)  02/15/20 201 lb 6.4 oz (91.4 kg)     GEN:  Well nourished, well developed in no acute distress HEENT: Normal NECK: No JVD; No carotid bruits LYMPHATICS: No  lymphadenopathy CARDIAC: RRR, no murmurs, rubs, gallops RESPIRATORY:  Clear to auscultation without rales, wheezing or rhonchi  ABDOMEN: Soft, non-tender, non-distended MUSCULOSKELETAL:  No edema; No deformity  SKIN: Warm and dry NEUROLOGIC:  Alert and oriented x 3 PSYCHIATRIC:  Normal affect   ASSESSMENT:    1. Coronary artery disease involving native coronary artery of native heart without angina pectoris   2. Mixed hyperlipidemia   3. Primary hypertension   4. Leg numbness   5. Screening for diabetes mellitus (DM)    PLAN:     CAD: Cath 12/1998 showed 30% ostial LAD stenosis.  Exercise Myoview on 04/2017 showed normal perfusion, EF 54%, below average exercise  tolerance (5.8 METS).  Echocardiogram 05/21/2017 showed EF 55 to 60%, no significant valvular disease.  No anginal symptoms. -Continue atorvastatin 40 mg daily.  Will check lipid panel for goal LDL less than 70 -Continue aspirin 81 mg daily  Hypertension: On amlodipine 5 mg daily.  BP mildly elevated in clinic but reports has been under good control at home.  Asked to check BP twice daily for next week and call with results  Hyperlipidemia: On atorvastatin 40 mg daily.  Most recent LDL 108 04/02/20.  We will recheck lipid panel.  If remains elevated above goal LDL less than 70, will increase atorvastatin to 80 mg daily  Leg numbness: will check ABIs  Diabetes screening: Check A1c  RTC in 1 year  Medication Adjustments/Labs and Tests Ordered: Current medicines are reviewed at length with the patient today.  Concerns regarding medicines are outlined above.  Orders Placed This Encounter  Procedures   Comprehensive metabolic panel   CBC   Lipid panel   Hemoglobin A1c   EKG 12-Lead   VAS [place] ABI WITH/WO TBI   VAS [place] LOWER EXTREMITY ARTERIAL DUPLEX   No orders of the defined types were placed in this encounter.   Patient Instructions  Medication Instructions:  Your physician recommends that you continue on your  current medications as directed. Please refer to the Current Medication list given to you today.  *If you need a refill on your cardiac medications before your next appointment, please call your pharmacy*   Lab Work: CMET, CBC, Lipid, A1C today  If you have labs (blood work) drawn today and your tests are completely normal, you will receive your results only by: [place] (if you have MyChart) OR A paper copy in the mail If you have any lab test that is abnormal or we need to change your treatment, we will call you to review the results.   Testing/Procedures: Your physician has requested that you have an ankle brachial index (ABI). During this test an ultrasound and blood pressure cuff are used to evaluate the arteries that supply the arms and legs with blood. Allow thirty minutes for this exam. There are no restrictions or special instructions.  Follow-Up: At [facility], you and your health needs are our priority.  As part of our continuing mission to provide you with exceptional heart care, we have created designated Provider Care Teams.  These Care Teams include your primary Cardiologist (physician) and Advanced Practice Providers (APPs -  Physician Assistants and Nurse Practitioners) who all work together to provide you with the care you need, when you need it.  We recommend signing up for the patient portal called "MyChart".  Sign up information is provided on this After Visit Summary.  MyChart is used to connect with patients for Virtual Visits (Telemedicine).  Patients are able to view lab/test results, encounter notes, upcoming appointments, etc.  Non-urgent messages can be sent to your provider as well.   To learn more about what you can do with MyChart, go to [email or web address].    Your next appointment:   12 month(s)  The format for your next appointment:   In Person  Provider:   Oswaldo Milian, MD    Please check your blood pressure at home 2 times  daily, write it down.  Call the office or send message via Mychart with the readings in 1-2 weeks for Dr. Gardiner Rhyme to review.    Signed, Donato Heinz, MD  02/16/2021 8:49 AM    [facility]

## 2021-02-16 ENCOUNTER — Other Ambulatory Visit: Payer: Self-pay

## 2021-02-16 ENCOUNTER — Ambulatory Visit: Payer: 59 | Admitting: Cardiology

## 2021-02-16 VITALS — BP 134/90 | HR 72 | Ht 70.0 in | Wt 216.6 lb

## 2021-02-16 DIAGNOSIS — R200 Anesthesia of skin: Secondary | ICD-10-CM | POA: Diagnosis not present

## 2021-02-16 DIAGNOSIS — I2510 Atherosclerotic heart disease of native coronary artery without angina pectoris: Secondary | ICD-10-CM | POA: Diagnosis not present

## 2021-02-16 DIAGNOSIS — E782 Mixed hyperlipidemia: Secondary | ICD-10-CM

## 2021-02-16 DIAGNOSIS — Z131 Encounter for screening for diabetes mellitus: Secondary | ICD-10-CM

## 2021-02-16 DIAGNOSIS — I10 Essential (primary) hypertension: Secondary | ICD-10-CM | POA: Diagnosis not present

## 2021-02-16 LAB — COMPREHENSIVE METABOLIC PANEL
ALT: 28 IU/L (ref 0–44)
AST: 22 IU/L (ref 0–40)
Albumin/Globulin Ratio: 2 (ref 1.2–2.2)
Albumin: 4.3 g/dL (ref 3.8–4.8)
Alkaline Phosphatase: 126 IU/L — ABNORMAL HIGH (ref 44–121)
BUN/Creatinine Ratio: 18 (ref 10–24)
BUN: 16 mg/dL (ref 8–27)
Bilirubin Total: 0.4 mg/dL (ref 0.0–1.2)
CO2: 22 mmol/L (ref 20–29)
Calcium: 9.5 mg/dL (ref 8.6–10.2)
Chloride: 104 mmol/L (ref 96–106)
Creatinine, Ser: 0.87 mg/dL (ref 0.76–1.27)
Globulin, Total: 2.2 g/dL (ref 1.5–4.5)
Glucose: 93 mg/dL (ref 65–99)
Potassium: 4.3 mmol/L (ref 3.5–5.2)
Sodium: 141 mmol/L (ref 134–144)
Total Protein: 6.5 g/dL (ref 6.0–8.5)
eGFR: 98 mL/min/{1.73_m2} (ref >59)

## 2021-02-16 LAB — CBC
Hematocrit: 45.8 % (ref 37.5–51.0)
Hemoglobin: 15.5 g/dL (ref 13.0–17.7)
MCH: 28.9 pg (ref 26.6–33.0)
MCHC: 33.8 g/dL (ref 31.5–35.7)
MCV: 85 fL (ref 79–97)
Platelets: 209 10*3/uL (ref 150–450)
RBC: 5.37 x10E6/uL (ref 4.14–5.80)
RDW: 12.6 % (ref 11.6–15.4)
WBC: 7.2 10*3/uL (ref 3.4–10.8)

## 2021-02-16 LAB — HEMOGLOBIN A1C
Est. average glucose Bld gHb Est-mCnc: 111 mg/dL
Hgb A1c MFr Bld: 5.5 % (ref 4.8–5.6)

## 2021-02-16 LAB — LIPID PANEL
Chol/HDL Ratio: 3.2 ratio (ref 0.0–5.0)
Cholesterol, Total: 179 mg/dL (ref 100–199)
HDL: 56 mg/dL (ref >39)
LDL Chol Calc (NIH): 89 mg/dL (ref 0–99)
Triglycerides: 205 mg/dL — ABNORMAL HIGH (ref 0–149)
VLDL Cholesterol Cal: 34 mg/dL (ref 5–40)

## 2021-02-16 NOTE — Patient Instructions (Addendum)
Medication Instructions:  Your physician recommends that you continue on your current medications as directed. Please refer to the Current Medication list given to you today.  *If you need a refill on your cardiac medications before your next appointment, please call your pharmacy*   Lab Work: CMET, CBC, Lipid, A1C today  If you have labs (blood work) drawn today and your tests are completely normal, you will receive your results only by: [place] (if you have MyChart) OR A paper copy in the mail If you have any lab test that is abnormal or we need to change your treatment, we will call you to review the results.   Testing/Procedures: Your physician has requested that you have an ankle brachial index (ABI). During this test an ultrasound and blood pressure cuff are used to evaluate the arteries that supply the arms and legs with blood. Allow thirty minutes for this exam. There are no restrictions or special instructions.  Follow-Up: At [facility], you and your health needs are our priority.  As part of our continuing mission to provide you with exceptional heart care, we have created designated Provider Care Teams.  These Care Teams include your primary Cardiologist (physician) and Advanced Practice Providers (APPs -  Physician Assistants and Nurse Practitioners) who all work together to provide you with the care you need, when you need it.  We recommend signing up for the patient portal called "MyChart".  Sign up information is provided on this After Visit Summary.  MyChart is used to connect with patients for Virtual Visits (Telemedicine).  Patients are able to view lab/test results, encounter notes, upcoming appointments, etc.  Non-urgent messages can be sent to your provider as well.   To learn more about what you can do with MyChart, go to [email or web address].    Your next appointment:   12 month(s)  The format for your next appointment:   In Person  Provider:    Oswaldo Milian, MD    Please check your blood pressure at home 2 times daily, write it down.  Call the office or send message via Mychart with the readings in 1-2 weeks for Dr. Gardiner Rhyme to review.

## 2021-02-18 ENCOUNTER — Other Ambulatory Visit: Payer: Self-pay

## 2021-02-18 ENCOUNTER — Ambulatory Visit (HOSPITAL_COMMUNITY)
Admission: RE | Admit: 2021-02-18 | Discharge: 2021-02-18 | Disposition: A | Discharge status: Home / Self Care | Payer: 59 | Source: Ambulatory Visit | Attending: Cardiology | Admitting: Cardiology

## 2021-02-18 DIAGNOSIS — R200 Anesthesia of skin: Secondary | ICD-10-CM | POA: Insufficient documentation

## 2021-02-23 NOTE — Telephone Encounter (Signed)
That is fine, can try dietary changes and plan repeat fasting lipid panel in 6 months

## 2021-02-25 ENCOUNTER — Encounter: Payer: Self-pay | Admitting: *Deleted

## 2021-04-08 ENCOUNTER — Other Ambulatory Visit: Payer: Self-pay

## 2021-04-08 ENCOUNTER — Encounter (HOSPITAL_COMMUNITY): Payer: Self-pay

## 2021-04-08 ENCOUNTER — Emergency Department (HOSPITAL_COMMUNITY)
Admission: EM | Admit: 2021-04-08 | Discharge: 2021-04-08 | Disposition: A | Discharge status: Home / Self Care | Payer: 59 | Attending: Emergency Medicine | Admitting: Emergency Medicine

## 2021-04-08 DIAGNOSIS — I2510 Atherosclerotic heart disease of native coronary artery without angina pectoris: Secondary | ICD-10-CM | POA: Insufficient documentation

## 2021-04-08 DIAGNOSIS — I10 Essential (primary) hypertension: Secondary | ICD-10-CM | POA: Insufficient documentation

## 2021-04-08 DIAGNOSIS — F17290 Nicotine dependence, other tobacco product, uncomplicated: Secondary | ICD-10-CM | POA: Diagnosis not present

## 2021-04-08 DIAGNOSIS — R339 Retention of urine, unspecified: Secondary | ICD-10-CM | POA: Insufficient documentation

## 2021-04-08 LAB — URINALYSIS, ROUTINE W REFLEX MICROSCOPIC
Bacteria, UA: NONE SEEN
Bilirubin Urine: NEGATIVE
Glucose, UA: NEGATIVE mg/dL
Ketones, ur: NEGATIVE mg/dL
Leukocytes,Ua: NEGATIVE
Nitrite: POSITIVE — AB
Protein, ur: NEGATIVE mg/dL
Specific Gravity, Urine: 1.01 (ref 1.005–1.030)
pH: 6 (ref 5.0–8.0)

## 2021-04-08 LAB — BASIC METABOLIC PANEL
Anion gap: 10 (ref 5–15)
BUN: 16 mg/dL (ref 8–23)
CO2: 24 mmol/L (ref 22–32)
Calcium: 9.6 mg/dL (ref 8.9–10.3)
Chloride: 102 mmol/L (ref 98–111)
Creatinine, Ser: 1 mg/dL (ref 0.61–1.24)
GFR, Estimated: >60 mL/min (ref >60)
Glucose, Bld: 124 mg/dL — ABNORMAL HIGH (ref 70–99)
Potassium: 3.3 mmol/L — ABNORMAL LOW (ref 3.5–5.1)
Sodium: 136 mmol/L (ref 135–145)

## 2021-04-08 LAB — CBC
HCT: 44.5 % (ref 39.0–52.0)
Hemoglobin: 14.9 g/dL (ref 13.0–17.0)
MCH: 29.3 pg (ref 26.0–34.0)
MCHC: 33.5 g/dL (ref 30.0–36.0)
MCV: 87.4 fL (ref 80.0–100.0)
Platelets: 225 10*3/uL (ref 150–400)
RBC: 5.09 MIL/uL (ref 4.22–5.81)
RDW: 13.2 % (ref 11.5–15.5)
WBC: 11 10*3/uL — ABNORMAL HIGH (ref 4.0–10.5)
nRBC: 0 % (ref 0.0–0.2)

## 2021-04-08 NOTE — ED Provider Notes (Signed)
[facility] Emergency Department Provider Note MRN:  [id number]  Arrival date & time: 04/08/21     Chief Complaint   Urinary Retention   History of Present Illness   Steven Henson is a 60 y.o. year-old male with a history of CAD, kidney stones presenting to the ED with chief complaint of urinary retention.  Location: Suprapubic region Duration: 5 hours Onset: Gradual Timing: Constant Description: Pain and discomfort, pressure Severity: Severe Exacerbating/Alleviating Factors: None Associated Symptoms: Unable to urine Pertinent Negatives: No fever, no upper abdominal pain, no flank pain  Additional History: Unable to urinate for the past 5 hours, worsening suprapubic  Review of Systems  A complete 10 system review of systems was obtained and all systems are negative except as noted in the HPI and PMH.   Patient's Health History    Past Medical History:  Diagnosis Date   Arthritis    lower back   Coronary artery disease    Family history of adverse reaction to anesthesia    "my mother had a hard waking up from anesthesia"   H/O echocardiogram 12/04/1997   Normal LV systolic function. No valvular abnormalities.   H/O myocardial perfusion scan 05/20/2009   The post stress myocardial perfusion images show a normal pattern of perfusion in all regions. the post stress left ventricle is normal in size. no significant wall motion abnormalities noted. Normal myocardial perfusion study. this is a low risk scan.   History of kidney stones    "lots"   Hyperlipidemia    Hypertension    Kidney stones    Lipoma    on back   Sleep apnea    no CPAP use - denies apnea, waking up gasping/choking, denies daytime sleepiness    Past Surgical History:  Procedure Laterality Date   BACK SURGERY     Micro surgery at [facility] 2012?   CARDIAC CATHETERIZATION  > 10 yrs.   CYSTOSCOPY/RETROGRADE/URETEROSCOPY/STONE EXTRACTION WITH BASKET  1/02, 12/01   EXTRACORPOREAL  SHOCK WAVE LITHOTRIPSY Left 05/17/2019   Procedure: EXTRACORPOREAL SHOCK WAVE LITHOTRIPSY (ESWL);  Surgeon: Ceasar Mons, MD;  Location: [facility];  Service: Urology;  Laterality: Left;   EXTRACORPOREAL SHOCK WAVE LITHOTRIPSY Right 03/13/2020   Procedure: EXTRACORPOREAL SHOCK WAVE LITHOTRIPSY (ESWL);  Surgeon: Ceasar Mons, MD;  Location: [facility];  Service: Urology;  Laterality: Right;   FRACTURE SURGERY  1974   right leg, both humerus, right wrist   KIDNEY STONE SURGERY  2006   LIPOMA EXCISION  05/14/2011   Procedure: EXCISION LIPOMA;  Surgeon: Belva Crome, MD;  Location: [place];  Service: General;  Laterality: Right;  EXCISION RIGHT UPPER BACK LIPOMA   ORIF FINGER / THUMB FRACTURE  2002   right   SHOULDER ARTHROSCOPY W/ SUBACROMIAL DECOMPRESSION AND DISTAL CLAVICLE EXCISION  7/08   left    Family History  Problem Relation Age of Onset   Anesthesia problems Mother        effects lasted for days   Heart disease Mother    Heart disease Father    Heart disease Sister    Lung cancer Maternal Grandfather    Heart disease Paternal Grandmother    Heart disease Paternal Grandfather    Heart failure Paternal Grandfather    COPD Paternal Grandfather    Parkinson's disease Maternal Grandmother     Social History   Socioeconomic History   Marital status: Married    Spouse name: Not on file  Number of children: 2   Years of education: Not on file   Highest education level: Not on file  Occupational History   Occupation: Paramedic    Employer: [employer]  Tobacco Use   Smoking status: Light Smoker    Types: Cigars    Last attempt to quit: 2009    Years since quitting: 13.7   Smokeless tobacco: Never   Tobacco comments:    maybe 2 cigars weekly  Vaping Use   Vaping Use: Never used  Substance and Sexual Activity   Alcohol use: Yes    Alcohol/week: 2.0 standard drinks    Types: 2 Shots of liquor per week   Drug  use: No   Sexual activity: Yes  Other Topics Concern   Not on file  Social History Narrative   Not on file   Social Determinants of Health   Financial Resource Strain: Not on file  Food Insecurity: Not on file  Transportation Needs: Not on file  Physical Activity: Not on file  Stress: Not on file  Social Connections: Not on file  Intimate Partner Violence: Not on file     Physical Exam   Vitals:   04/08/21 0043 04/08/21 0215  BP: (!) 150/101 (!) 144/87  Pulse: (!) 110 78  Resp: 16 18  Temp: 97.6 F (36.4 C)   SpO2: 100% 99%    CONSTITUTIONAL: Well-appearing, in moderate distress due to pain NEURO:  Alert and oriented x 3, no focal deficits EYES:  eyes equal and reactive ENT/NECK:  no LAD, no JVD CARDIO: Regular rate, well-perfused, normal S1 and S2 PULM:  CTAB no wheezing or rhonchi GI/GU:  normal bowel sounds, moderate suprapubic tenderness and distention MSK/SPINE:  No gross deformities, no edema SKIN:  no rash, atraumatic PSYCH:  Appropriate speech and behavior  *Additional and/or pertinent findings included in MDM below  Diagnostic and Interventional Summary    EKG Interpretation  Date/Time:    Ventricular Rate:    PR Interval:    QRS Duration:   QT Interval:    QTC Calculation:   R Axis:     Text Interpretation:         Labs Reviewed  CBC - Abnormal; Notable for the following components:      Result Value   WBC 11.0 (*)    All other components within normal limits  BASIC METABOLIC PANEL - Abnormal; Notable for the following components:   Potassium 3.3 (*)    Glucose, Bld 124 (*)    All other components within normal limits  URINALYSIS, ROUTINE W REFLEX MICROSCOPIC - Abnormal; Notable for the following components:   Color, Urine AMBER (*)    Hgb urine dipstick MODERATE (*)    Nitrite POSITIVE (*)    All other components within normal limits    No orders to display    Medications - No data to display   Procedures  /  Critical  Care Procedures  ED Course and Medical Decision Making  I have reviewed the triage vital signs, the nursing notes, and pertinent available records from the EMR.  Listed above are laboratory and imaging tests that I personally ordered, reviewed, and interpreted and then considered in my medical decision making (see below for details).  Urinary retention, placing Foley and will reassess.     Patient feeling all the way better after Foley, drainage of 1 L of urine.  Abdomen soft and nontender, vital signs normal.  Labs reassuring with no AKI, urinalysis is inconsistent  with infection.  Patient has close follow-up tomorrow with urology, appropriate for discharge.  Barth Kirks. [place]onia Small, [place] [email or web address]  Final Clinical Impressions(s) / ED Diagnoses     ICD-10-CM   1. Urinary retention  R33.9       ED Discharge Orders     None        Discharge Instructions Discussed with and Provided to Patient:     Discharge Instructions      You were evaluated in the Emergency Department and after careful evaluation, we did not find any emergent condition requiring admission or further testing in the hospital.  Your exam/testing today was overall reassuring.  Keep your follow-up with your urologist for further management of your Foley catheter/urinary retention.  Please return to the Emergency Department if you experience any worsening of your condition.  Thank you for allowing [place] to be a part of your care.         Maudie Flakes, MD 04/08/21 [phone]

## 2021-04-08 NOTE — ED Triage Notes (Signed)
Pt had a procedure on Sept 29, Pt removed the catheter yesterday morning at 0500. Pt states that he hasn't urinated in 5 hrs. Upon bladder scan 850 ml retention.

## 2021-04-08 NOTE — Discharge Instructions (Addendum)
You were evaluated in the Emergency Department and after careful evaluation, we did not find any emergent condition requiring admission or further testing in the hospital.  Your exam/testing today was overall reassuring.  Keep your follow-up with your urologist for further management of your Foley catheter/urinary retention.  Please return to the Emergency Department if you experience any worsening of your condition.  Thank you for allowing [place] to be a part of your care.

## 2021-04-29 ENCOUNTER — Ambulatory Visit (INDEPENDENT_AMBULATORY_CARE_PROVIDER_SITE_OTHER)
Admission: RE | Admit: 2021-04-29 | Discharge: 2021-04-29 | Disposition: A | Discharge status: Home / Self Care | Payer: 59 | Source: Ambulatory Visit | Attending: Acute Care | Admitting: Acute Care

## 2021-04-29 ENCOUNTER — Other Ambulatory Visit: Payer: Self-pay

## 2021-04-29 DIAGNOSIS — Z87891 Personal history of nicotine dependence: Secondary | ICD-10-CM

## 2021-05-06 ENCOUNTER — Other Ambulatory Visit: Payer: Self-pay | Admitting: Acute Care

## 2021-05-06 DIAGNOSIS — Z87891 Personal history of nicotine dependence: Secondary | ICD-10-CM

## 2021-05-19 ENCOUNTER — Ambulatory Visit
Admission: EM | Admit: 2021-05-19 | Discharge: 2021-05-19 | Disposition: A | Discharge status: Home / Self Care | Payer: 59 | Attending: Emergency Medicine | Admitting: Emergency Medicine

## 2021-05-19 DIAGNOSIS — S61451A Open bite of right hand, initial encounter: Secondary | ICD-10-CM | POA: Diagnosis not present

## 2021-05-19 DIAGNOSIS — S61431A Puncture wound without foreign body of right hand, initial encounter: Secondary | ICD-10-CM

## 2021-05-19 DIAGNOSIS — Z23 Encounter for immunization: Secondary | ICD-10-CM

## 2021-05-19 MED ORDER — TETANUS-DIPHTH-ACELL PERTUSSIS 5-2.5-18.5 LF-MCG/0.5 IM SUSY
0.5000 mL | PREFILLED_SYRINGE | Freq: Once | INTRAMUSCULAR | Status: AC
  Administered 2021-05-19: 0.5 mL via INTRAMUSCULAR

## 2021-05-19 MED ORDER — SULFAMETHOXAZOLE-TRIMETHOPRIM 800-160 MG PO TABS: 1.0000 | ORAL_TABLET | Freq: Two times a day (BID) | ORAL | 0 refills | Status: AC

## 2021-05-19 MED ORDER — CLINDAMYCIN HCL 150 MG PO CAPS: 450.0000 mg | ORAL_CAPSULE | Freq: Three times a day (TID) | ORAL | 0 refills | Status: AC

## 2021-05-19 NOTE — Discharge Instructions (Addendum)
You received a tetanus diphtheria and pertussis booster today.  For animal bite wound infection prophylaxis, please begin taking clindamycin and Bactrim as prescribed, clindamycin is 3 tablets 3 times a day and Bactrim is 1 tablet twice a day.  Please continue taking these antibiotics until you have complete resolution of redness, pain and swelling for at least 24 hours or until you have completed at least 7 days worth, whichever comes first.  If you have completed the full 14-day course, or while you are taking these antibiotics you begin to have worsening redness, swelling, pain, purulent drainage, fever greater than 101.5, or you notice red streaking going up your arm please report to the emergency room as soon as possible for emergent evaluation, you may require intravenous antibiotics.

## 2021-05-19 NOTE — ED Provider Notes (Signed)
[facility]    CSN: [id number] Arrival date & time: 05/19/21  1305   History   Chief Complaint No chief complaint on file.  HPI Steven Henson is a 60 y.o. male. Patient reports being bitten by dog last night, states the dog was a medium size and he feels that the dog's eye teeth went into his hand approximately quarter of an inch.  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  Patient states that he applied clean bandaging secured with Coban, patient is here today to have it evaluated, states he probably needs a tetanus vaccine.  Patient is afebrile on exam today, his blood pressure is elevated.  The history is provided by the patient.   Past Medical History:  Diagnosis Date   Arthritis    lower back   Coronary artery disease    Family history of adverse reaction to anesthesia    "my mother had a hard waking up from anesthesia"   H/O echocardiogram 12/04/1997   Normal LV systolic function. No valvular abnormalities.   H/O myocardial perfusion scan 05/20/2009   The post stress myocardial perfusion images show a normal pattern of perfusion in all regions. the post stress left ventricle is normal in size. no significant wall motion abnormalities noted. Normal myocardial perfusion study. this is a low risk scan.   History of kidney stones    "lots"   Hyperlipidemia    Hypertension    Kidney stones    Lipoma    on back   Sleep apnea    no CPAP use - denies apnea, waking up gasping/choking, denies daytime sleepiness   Patient Active Problem List   Diagnosis Date Noted   Spinal stenosis at L4-L5 level 08/28/2018   Lightheadedness 10/20/2017   Orthostatic lightheadedness 04/22/2017   Dyspnea on exertion 04/22/2017   Dizziness 09/05/2013   Coronary artery disease involving native coronary artery of native heart without angina pectoris  07/18/2013   Palpitations 05/09/2012   Atypical chest pain 04/24/2012   Mixed hyperlipidemia 04/24/2012   HTN (hypertension) 04/24/2012   Postop check 05/25/2011   Rilght upper back lipoma 04/13/2011   Past Surgical History:  Procedure Laterality Date   BACK SURGERY     Micro surgery at [facility] 2012?   CARDIAC CATHETERIZATION  > 10 yrs.   CYSTOSCOPY/RETROGRADE/URETEROSCOPY/STONE EXTRACTION WITH BASKET  1/02, 12/01   EXTRACORPOREAL SHOCK WAVE LITHOTRIPSY Left 05/17/2019   Procedure: EXTRACORPOREAL SHOCK WAVE LITHOTRIPSY (ESWL);  Surgeon: Ceasar Mons, MD;  Location: [facility];  Service: Urology;  Laterality: Left;   EXTRACORPOREAL SHOCK WAVE LITHOTRIPSY Right 03/13/2020   Procedure: EXTRACORPOREAL SHOCK WAVE LITHOTRIPSY (ESWL);  Surgeon: Ceasar Mons, MD;  Location: [facility];  Service: Urology;  Laterality: Right;   FRACTURE SURGERY  1974   right leg, both humerus, right wrist   KIDNEY STONE SURGERY  2006   LIPOMA EXCISION  05/14/2011   Procedure: EXCISION LIPOMA;  Surgeon: Belva Crome, MD;  Location: [facility];  Service: General;  Laterality: Right;  EXCISION RIGHT UPPER BACK LIPOMA   ORIF FINGER / THUMB FRACTURE  2002   right   SHOULDER ARTHROSCOPY W/ SUBACROMIAL DECOMPRESSION AND DISTAL CLAVICLE EXCISION  7/08   left    Home Medications    Prior to Admission medications   Medication Sig  Start Date End Date Taking? Authorizing Provider  clindamycin (CLEOCIN) 150 MG capsule Take 3 capsules (450 mg total) by mouth 3 (three) times daily for 14 days. 05/19/21 06/02/21 Yes Lynden Oxford Scales, PA-C  sulfamethoxazole-trimethoprim (BACTRIM DS) 800-160 MG tablet Take 1 tablet by mouth 2 (two) times daily for 14 days. 05/19/21 06/02/21 Yes Lynden Oxford Scales, PA-C  acetaminophen (TYLENOL) 500 MG tablet Take 1,000 mg by mouth every 6 (six) hours as needed for moderate pain.    [provider]  amLODipine (NORVASC)  5 MG tablet Take 5 mg by mouth daily. 07/18/18   [provider]  aspirin EC 81 MG tablet Take 81 mg by mouth daily.    [provider]  atorvastatin (LIPITOR) 40 MG tablet Take 1 tablet by mouth at  bedtime 09/19/14   Larey Dresser, MD  fish oil-omega-3 fatty acids 1000 MG capsule Take 2,000 mg by mouth 2 (two) times daily.     [provider]   Family History Family History  Problem Relation Age of Onset   Anesthesia problems Mother        effects lasted for days   Heart disease Mother    Heart disease Father    Heart disease Sister    Lung cancer Maternal Grandfather    Heart disease Paternal Grandmother    Heart disease Paternal Grandfather    Heart failure Paternal Grandfather    COPD Paternal Grandfather    Parkinson's disease Maternal Grandmother    Social History Social History   Tobacco Use   Smoking status: Light Smoker    Types: Cigars    Last attempt to quit: 2009    Years since quitting: 13.8   Smokeless tobacco: Never   Tobacco comments:    maybe 2 cigars weekly  Vaping Use   Vaping Use: Never used  Substance Use Topics   Alcohol use: Yes    Alcohol/week: 2.0 standard drinks    Types: 2 Shots of liquor per week   Drug use: No   Allergies   Ciprofloxacin, Benzoin, Lactated ringers, and Tincture of benzoin [benzoin compound]  Review of Systems Review of Systems Pertinent findings noted in history of present illness.   Physical Exam Triage Vital Signs ED Triage Vitals  Enc Vitals Group     BP 05/01/21 0827 (!) 147/82     Pulse Rate 05/01/21 0827 72     Resp 05/01/21 0827 18     Temp 05/01/21 0827 98.3 F (36.8 C)     Temp Source 05/01/21 0827 Oral     SpO2 05/01/21 0827 98 %     Weight --      Height --      Head Circumference --      Peak Flow --      Pain Score 05/01/21 0826 5     Pain Loc --      Pain Edu? --      Excl. in [place]? --    No data found.  Updated Vital Signs BP (!) 150/94 (BP Location: Left Arm)    Pulse 81   Temp 97.9 F (36.6 C) (Oral)   Resp 20   SpO2 98%   Visual Acuity Right Eye Distance:   Left Eye Distance:   Bilateral Distance:    Right Eye Near:   Left Eye Near:    Bilateral Near:     Physical Exam Vitals and nursing note reviewed.  Constitutional:      General: He  is not in acute distress.    Appearance: Normal appearance. He is not ill-appearing.  HENT:     Head: Normocephalic and atraumatic.  Eyes:     General: Lids are normal.        Right eye: No discharge.        Left eye: No discharge.     Extraocular Movements: Extraocular movements intact.     Conjunctiva/sclera: Conjunctivae normal.     Right eye: Right conjunctiva is not injected.     Left eye: Left conjunctiva is not injected.  Neck:     Trachea: Trachea and phonation normal.  Cardiovascular:     Rate and Rhythm: Normal rate and regular rhythm.     Pulses: Normal pulses.     Heart sounds: Normal heart sounds. No murmur heard.   No friction rub. No gallop.  Pulmonary:     Effort: Pulmonary effort is normal. No accessory muscle usage, prolonged expiration or respiratory distress.     Breath sounds: Normal breath sounds. No stridor, decreased air movement or transmitted upper airway sounds. No decreased breath sounds, wheezing, rhonchi or rales.  Chest:     Chest wall: No tenderness.  Musculoskeletal:        General: Normal range of motion.     Cervical back: Normal range of motion and neck supple. Normal range of motion.  Lymphadenopathy:     Cervical: No cervical adenopathy.  Skin:    General: Skin is warm and dry.     Findings: No erythema or rash.     Comments: 1 cm laceration in the muscular tissue of the thenar eminence of his right.  Mild surrounding erythema, area is tender to palpation.  No purulent drainage appreciated, there is no foul odor.  Neurological:     General: No focal deficit present.     Mental Status: He is alert and oriented to person, place, and time.   Psychiatric:        Mood and Affect: Mood normal.        Behavior: Behavior normal.   UC Treatments / Results  Labs (all labs ordered are listed, but only abnormal results are displayed)  Labs Reviewed - No data to display  EKG  Radiology No results found.  Procedures Procedures (including critical care time)  Medications Ordered in UC Medications  Tdap (BOOSTRIX) injection 0.5 mL (0.5 mLs Intramuscular Given 05/19/21 1555)    Initial Impression / Assessment and Plan / UC Course  I have reviewed the triage vital signs and the nursing notes.  Pertinent labs & imaging results that were available during my care of the patient were reviewed by me and considered in my medical decision making (see chart for details).      Wound was soaked in warm water with Betadine solution for 20 minutes in the exam room, wound was probed and found to be no more than 1/4 inch deep, no signs of necrosis, minimal granulation, excess tissue flap was trimmed with sharp scissors.  Patient was dressed with bacitracin and a sterile Telfa pad, resecured with Coban.  Patient provided with a Tdap vaccine and prescriptions for clindamycin and Bactrim for coverage of staph and Pasteurella.  Patient was given return precautions  Final Clinical Impressions(s) / UC Diagnoses   Final diagnoses:  Animal bite of palm of right hand  Puncture wound of right hand without foreign body, initial encounter     Discharge Instructions      You received a tetanus diphtheria  and pertussis booster today.  For animal bite wound infection prophylaxis, please begin taking clindamycin and Bactrim as prescribed, clindamycin is 3 tablets 3 times a day and Bactrim is 1 tablet twice a day.  Please continue taking these antibiotics until you have complete resolution of redness, pain and swelling for at least 24 hours or until you have completed at least 7 days worth, whichever comes first.  If you have completed the full 14-day  course, or while you are taking these antibiotics you begin to have worsening redness, swelling, pain, purulent drainage, fever greater than 101.5, or you notice red streaking going up your arm please report to the emergency room as soon as possible for emergent evaluation, you may require intravenous antibiotics.     ED Prescriptions     Medication Sig Dispense Auth. Provider   sulfamethoxazole-trimethoprim (BACTRIM DS) 800-160 MG tablet Take 1 tablet by mouth 2 (two) times daily for 14 days. 28 tablet Lynden Oxford Scales, PA-C   clindamycin (CLEOCIN) 150 MG capsule Take 3 capsules (450 mg total) by mouth 3 (three) times daily for 14 days. 126 capsule Lynden Oxford Scales, PA-C      PDMP not reviewed this encounter.  Disposition Upon Discharge:  Patient presented with an acute illness with associated systemic symptoms and significant discomfort requiring urgent management. In my opinion, this is a condition that a prudent lay person (someone who possesses an average knowledge of health and medicine) may potentially expect to result in complications if not addressed urgently such as respiratory distress, impairment of bodily function or dysfunction of bodily organs.   Routine symptom specific, illness specific and/or disease specific instructions were discussed with the patient and/or caregiver at length.   As such, the patient has been evaluated and assessed, work-up was performed and treatment was provided in alignment with urgent care protocols and evidence based medicine.  Patient/parent/caregiver has been advised that the patient may require follow up for further testing and treatment if the symptoms continue in spite of treatment, as clinically indicated and appropriate.  Patient was tested for COVID-19, Influenza and/or RSV, the patient/parent/guardian was advised to isolate at home pending the results of his/her diagnostic coronavirus test and potentially longer if they're  positive. Advised pt that if his/her COVID-19 test returns positive, it's recommended to self-isolate for at least 10 days after symptoms first appeared AND until fever-free for 24 hours without fever reducer AND other symptoms have improved or resolved. Discussed self-isolation recommendations as well as instructions for household member/close contacts as per the [facility] and [place] DHHS, and also gave patient the [place] packet with this information.  Patient/parent/caregiver has been advised to return to the [facility] or PCP in 3-5 days if no better; to PCP or the Emergency Department if new signs and symptoms develop, or if the current signs or symptoms continue to change or worsen for further workup, evaluation and treatment as clinically indicated and appropriate  The patient will follow up with their current PCP if and as advised. If the patient does not currently have a PCP we will assist them in obtaining one.   The patient may need specialty follow up if the symptoms continue, in spite of conservative treatment and management, for further workup, evaluation, consultation and treatment as clinically indicated and appropriate.  Patient/parent/caregiver verbalized understanding and agreement of plan as discussed.  All questions were addressed during visit.  Please see discharge instructions below for further details of plan.  Condition: stable for discharge home Home: take  medications as prescribed; routine discharge instructions as discussed; follow up as advised.    Lynden Oxford Scales, PA-C 05/19/21 [phone]

## 2021-05-19 NOTE — ED Triage Notes (Addendum)
Pt states last night he was bitten by a dog, pt states the dog was up to date on vaccinations. Patient has coban around right hand. Puncture to right hand, not actively bleeding.

## 2021-10-05 ENCOUNTER — Other Ambulatory Visit: Payer: Self-pay | Admitting: *Deleted

## 2021-10-05 ENCOUNTER — Encounter: Payer: Self-pay | Admitting: *Deleted

## 2021-10-05 DIAGNOSIS — E782 Mixed hyperlipidemia: Secondary | ICD-10-CM

## 2022-04-29 ENCOUNTER — Ambulatory Visit (HOSPITAL_COMMUNITY)
Admission: RE | Admit: 2022-04-29 | Discharge: 2022-04-29 | Disposition: A | Discharge status: Home / Self Care | Payer: 59 | Source: Ambulatory Visit | Attending: Family Medicine | Admitting: Family Medicine

## 2022-04-29 DIAGNOSIS — Z87891 Personal history of nicotine dependence: Secondary | ICD-10-CM | POA: Diagnosis present

## 2022-05-03 ENCOUNTER — Other Ambulatory Visit: Payer: Self-pay | Admitting: Acute Care

## 2022-05-03 DIAGNOSIS — Z122 Encounter for screening for malignant neoplasm of respiratory organs: Secondary | ICD-10-CM

## 2022-05-03 DIAGNOSIS — Z87891 Personal history of nicotine dependence: Secondary | ICD-10-CM

## 2022-07-11 NOTE — Progress Notes (Unsigned)
Cardiology Office Note:    Date:  02/16/2021   ID:  Steven Henson, DOB 29-Jul-1960, MRN [id number]  PCP:  Lujean Amel, MD  Cardiologist:  Mertie Moores, MD  Electrophysiologist:  None   Referring MD: Lujean Amel, MD   Chief Complaint  Patient presents with   Coronary Artery Disease    History of Present Illness:    Steven Henson is a 62 y.o. male with a hx of nonobstructive CAD, former tobacco use, hyperlipidemia, hypertension who presents for follow-up.   Cath 12/1998 showed 30% ostial LAD stenosis.  Exercise Myoview on 04/2017 showed normal perfusion, EF 54%, below average exercise tolerance (5.8 METS).  Echocardiogram 05/21/2017 showed EF 55 to 60%, no significant valvular disease.    Since last clinic visit,  he reports that he has been doing well.  He denies any chest pain, dyspnea, or palpitations.  He has history of vertigo and reports intermittent dizziness but denies any syncope.  Reports occasional left lower extremity edema.  Also reports persistent numbness in left leg.  Reports BP has been controlled when he checks it at home.  He plays pickleball 2-3 times per week and walks his dog about 1.5 miles daily.  Denies any exertional symptoms.    Past Medical History:  Diagnosis Date   Arthritis    lower back   Coronary artery disease    Family history of adverse reaction to anesthesia    "my mother had a hard waking up from anesthesia"   H/O echocardiogram 12/04/1997   Normal LV systolic function. No valvular abnormalities.   H/O myocardial perfusion scan 05/20/2009   The post stress myocardial perfusion images show a normal pattern of perfusion in all regions. the post stress left ventricle is normal in size. no significant wall motion abnormalities noted. Normal myocardial perfusion study. this is a low risk scan.   History of kidney stones    "lots"   Hyperlipidemia    Hypertension    Kidney stones    Lipoma    on back   Sleep apnea    no CPAP use -  denies apnea, waking up gasping/choking, denies daytime sleepiness    Past Surgical History:  Procedure Laterality Date   BACK SURGERY     Micro surgery at [facility] 2012?   CARDIAC CATHETERIZATION  > 10 yrs.   CYSTOSCOPY/RETROGRADE/URETEROSCOPY/STONE EXTRACTION WITH BASKET  1/02, 12/01   EXTRACORPOREAL SHOCK WAVE LITHOTRIPSY Left 05/17/2019   Procedure: EXTRACORPOREAL SHOCK WAVE LITHOTRIPSY (ESWL);  Surgeon: Ceasar Mons, MD;  [facility];  Service: Urology;  Laterality: Left;   EXTRACORPOREAL SHOCK WAVE LITHOTRIPSY Right 03/13/2020   Procedure: EXTRACORPOREAL SHOCK WAVE LITHOTRIPSY (ESWL);  Surgeon: Ceasar Mons, MD;  Location: [facility];  Service: Urology;  Laterality: Right;   FRACTURE SURGERY  1974   right leg, both humerus, right wrist   KIDNEY STONE SURGERY  2006   LIPOMA EXCISION  05/14/2011   Procedure: EXCISION LIPOMA;  Surgeon: Belva Crome, MD;  Location: [place];  Service: General;  Laterality: Right;  EXCISION RIGHT UPPER BACK LIPOMA   ORIF FINGER / THUMB FRACTURE  2002   right   SHOULDER ARTHROSCOPY W/ SUBACROMIAL DECOMPRESSION AND DISTAL CLAVICLE EXCISION  7/08   left    Current Medications: No outpatient medications have been marked as taking for the 02/16/21 encounter (Office Visit) with Steven Heinz, MD.     Allergies:   Ciprofloxacin, Benzoin, Lactated ringers, and Tincture of  benzoin [benzoin compound]   Social History   Socioeconomic History   Marital status: Married    Spouse name: Not on file   Number of children: 2   Years of education: Not on file   Highest education level: Not on file  Occupational History   Occupation: Paramedic    Employer: [employer]  Tobacco Use   Smoking status: Light Smoker    Types: Cigars    Last attempt to quit: 2009    Years since quitting: 13.6   Smokeless tobacco: Never   Tobacco comments:    maybe 2 cigars weekly  Vaping Use    Vaping Use: Never used  Substance and Sexual Activity   Alcohol use: Yes    Alcohol/week: 2.0 standard drinks    Types: 2 Shots of liquor per week   Drug use: No   Sexual activity: Yes  Other Topics Concern   Not on file  Social History Narrative   Not on file   Social Determinants of Health   Financial Resource Strain: Not on file  Food Insecurity: Not on file  Transportation Needs: Not on file  Physical Activity: Not on file  Stress: Not on file  Social Connections: Not on file     Family History: The patient's family history includes Anesthesia problems in his mother; COPD in his paternal grandfather; Heart disease in his father, mother, paternal grandfather, paternal grandmother, and sister; Heart failure in his paternal grandfather; Lung cancer in his maternal grandfather; Parkinson's disease in his maternal grandmother.  ROS:   Please see the history of present illness.     All other systems reviewed and are negative.  EKGs/Labs/Other Studies Reviewed:    The following studies were reviewed today:   EKG:  EKG is  ordered today.  The ekg ordered today demonstrates NSR, rate 72, no ST abnormalities  Recent Labs: No results found for requested labs within last 8760 hours.  Recent Lipid Panel    Component Value Date/Time   CHOL 162 08/14/2014 0807   TRIG 174.0 (H) 08/14/2014 0807   HDL 52.00 08/14/2014 0807   CHOLHDL 3 08/14/2014 0807   VLDL 34.8 08/14/2014 0807   LDLCALC 75 08/14/2014 0807    Physical Exam:    VS:  BP 134/90   Pulse 72   Ht '5\' 10"'$  (1.778 m)   Wt 216 lb 9.6 oz (98.2 kg)   SpO2 98%   BMI 31.08 kg/m     Wt Readings from Last 3 Encounters:  02/16/21 216 lb 9.6 oz (98.2 kg)  03/13/20 198 lb (89.8 kg)  02/15/20 201 lb 6.4 oz (91.4 kg)     GEN:  Well nourished, well developed in no acute distress HEENT: Normal NECK: No JVD; No carotid bruits LYMPHATICS: No lymphadenopathy CARDIAC: RRR, no murmurs, rubs, gallops RESPIRATORY:  Clear to  auscultation without rales, wheezing or rhonchi  ABDOMEN: Soft, non-tender, non-distended MUSCULOSKELETAL:  No edema; No deformity  SKIN: Warm and dry NEUROLOGIC:  Alert and oriented x 3 PSYCHIATRIC:  Normal affect   ASSESSMENT:    1. Coronary artery disease involving native coronary artery of native heart without angina pectoris   2. Mixed hyperlipidemia   3. Primary hypertension   4. Leg numbness   5. Screening for diabetes mellitus (DM)    PLAN:     CAD: Cath 12/1998 showed 30% ostial LAD stenosis.  Exercise Myoview on 04/2017 showed normal perfusion, EF 54%, below average exercise tolerance (5.8 METS).  Echocardiogram 05/21/2017 showed  EF 55 to 60%, no significant valvular disease.  No anginal symptoms. -Continue atorvastatin 80 mg daily.  Check lipid panel -Continue aspirin 81 mg daily  Hypertension: On amlodipine 5 mg daily.    Hyperlipidemia: LDL 89 on 02/16/2021, atorvastatin increased to 80 mg daily.  Check lipid panel***  Leg numbness: Normal ABIs 02/18/2021   RTC in***  Medication Adjustments/Labs and Tests Ordered: Current medicines are reviewed at length with the patient today.  Concerns regarding medicines are outlined above.  Orders Placed This Encounter  Procedures   Comprehensive metabolic panel   CBC   Lipid panel   Hemoglobin A1c   EKG 12-Lead   VAS [place] ABI WITH/WO TBI   VAS [place] LOWER EXTREMITY ARTERIAL DUPLEX   No orders of the defined types were placed in this encounter.   Patient Instructions  Medication Instructions:  Your physician recommends that you continue on your current medications as directed. Please refer to the Current Medication list given to you today.  *If you need a refill on your cardiac medications before your next appointment, please call your pharmacy*   Lab Work: CMET, CBC, Lipid, A1C today  If you have labs (blood work) drawn today and your tests are completely normal, you will receive your results only by: [place]  (if you have MyChart) OR A paper copy in the mail If you have any lab test that is abnormal or we need to change your treatment, we will call you to review the results.   Testing/Procedures: Your physician has requested that you have an ankle brachial index (ABI). During this test an ultrasound and blood pressure cuff are used to evaluate the arteries that supply the arms and legs with blood. Allow thirty minutes for this exam. There are no restrictions or special instructions.  Follow-Up: At [facility], you and your health needs are our priority.  As part of our continuing mission to provide you with exceptional heart care, we have created designated Provider Care Teams.  These Care Teams include your primary Cardiologist (physician) and Advanced Practice Providers (APPs -  Physician Assistants and Nurse Practitioners) who all work together to provide you with the care you need, when you need it.  We recommend signing up for the patient portal called "MyChart".  Sign up information is provided on this After Visit Summary.  MyChart is used to connect with patients for Virtual Visits (Telemedicine).  Patients are able to view lab/test results, encounter notes, upcoming appointments, etc.  Non-urgent messages can be sent to your provider as well.   To learn more about what you can do with MyChart, go to [email or web address].    Your next appointment:   12 month(s)  The format for your next appointment:   In Person  Provider:   Oswaldo Milian, MD    Please check your blood pressure at home 2 times daily, write it down.  Call the office or send message via Mychart with the readings in 1-2 weeks for Dr. Gardiner Rhyme to review.    Signed, Steven Heinz, MD  02/16/2021 8:49 AM    [facility]

## 2022-07-13 ENCOUNTER — Ambulatory Visit: Payer: 59 | Attending: Cardiology | Admitting: Cardiology

## 2022-07-13 VITALS — BP 122/74 | HR 72 | Ht 70.0 in | Wt 216.0 lb

## 2022-07-13 DIAGNOSIS — I10 Essential (primary) hypertension: Secondary | ICD-10-CM

## 2022-07-13 DIAGNOSIS — E785 Hyperlipidemia, unspecified: Secondary | ICD-10-CM

## 2022-07-13 DIAGNOSIS — I2510 Atherosclerotic heart disease of native coronary artery without angina pectoris: Secondary | ICD-10-CM

## 2022-07-13 MED ORDER — ATORVASTATIN CALCIUM 80 MG PO TABS: 80.0000 mg | ORAL_TABLET | Freq: Every day | ORAL | 3 refills | Status: DC

## 2022-07-13 NOTE — Patient Instructions (Signed)
Medication Instructions:  INCREASE atorvastatin (Lipitor) to 80 mg daily  *If you need a refill on your cardiac medications before your next appointment, please call your pharmacy*   Lab Work: Please return for FASTING labs in 2-3 months (Lipid)  Our in office lab hours are Monday-Friday 8:00-4:00, closed for lunch 12:45-1:45 pm.  No appointment needed.  [facility] locations:   [place] - [address] [place] [address] - [address] [place] - [address] ([facility])  Follow-Up: At [facility], you and your health needs are our priority.  As part of our continuing mission to provide you with exceptional heart care, we have created designated Provider Care Teams.  These Care Teams include your primary Cardiologist (physician) and Advanced Practice Providers (APPs -  Physician Assistants and Nurse Practitioners) who all work together to provide you with the care you need, when you need it.  We recommend signing up for the patient portal called "MyChart".  Sign up information is provided on this After Visit Summary.  MyChart is used to connect with patients for Virtual Visits (Telemedicine).  Patients are able to view lab/test results, encounter notes, upcoming appointments, etc.  Non-urgent messages can be sent to your provider as well.   To learn more about what you can do with MyChart, go to [email or web address].    Your next appointment:   12 month(s)  The format for your next appointment:   In Person  Provider:   Dr. Gardiner Rhyme

## 2022-10-27 ENCOUNTER — Telehealth: Payer: Self-pay | Admitting: Cardiology

## 2022-10-27 DIAGNOSIS — E785 Hyperlipidemia, unspecified: Secondary | ICD-10-CM

## 2022-10-27 NOTE — Telephone Encounter (Signed)
Patient states since increasing the dose to 80 mg he is having a lot of muscle aches with his legs and just cannot handle it anymore.  He states he had no issues on the 40 mg. He would be willing to go back on that dose if acceptable.  Advised that generally the dosage is increased due to lab results or test results, but I would send to provider for his recommendations to see if dose can be reduced or other recommendations He ask about repeat lipid panel. Advised that it is time that can be checked.  He will do this lab soon to see how the increased dose was working.

## 2022-10-27 NOTE — Telephone Encounter (Signed)
Pt c/o medication issue:  1. Name of Medication:   atorvastatin (LIPITOR) 80 MG tablet    2. How are you currently taking this medication (dosage and times per day)?  Take 1 tablet (80 mg total) by mouth daily.   3. Are you having a reaction (difficulty breathing--STAT)? No  4. What is your medication issue? Pt states that medication is causing him to have muscle and leg pain. He would like a callback regarding this matter. Please advise

## 2022-10-28 NOTE — Telephone Encounter (Signed)
That is fine to drop it back to 40 mg.  Will check fasting lipid panel in 2 months

## 2022-11-03 MED ORDER — ATORVASTATIN CALCIUM 40 MG PO TABS: 40.0000 mg | ORAL_TABLET | Freq: Every day | ORAL | Status: DC

## 2022-11-03 NOTE — Telephone Encounter (Signed)
Patient aware and verbalized understanding.  Lab order mailed to repeat in 2 months

## 2022-11-04 LAB — LIPID PANEL
Chol/HDL Ratio: 2.9 ratio (ref 0.0–5.0)
Cholesterol, Total: 161 mg/dL (ref 100–199)
HDL: 55 mg/dL (ref >39)
LDL Chol Calc (NIH): 88 mg/dL (ref 0–99)
Triglycerides: 96 mg/dL (ref 0–149)
VLDL Cholesterol Cal: 18 mg/dL (ref 5–40)

## 2023-01-31 ENCOUNTER — Other Ambulatory Visit: Payer: Self-pay

## 2023-01-31 ENCOUNTER — Encounter: Payer: Self-pay | Admitting: Cardiology

## 2023-01-31 DIAGNOSIS — I2510 Atherosclerotic heart disease of native coronary artery without angina pectoris: Secondary | ICD-10-CM

## 2023-01-31 DIAGNOSIS — E782 Mixed hyperlipidemia: Secondary | ICD-10-CM

## 2023-02-25 IMAGING — CT CT CHEST LUNG CANCER SCREENING LOW DOSE W/O CM
1 series · 10 of 10 positions shown, 13 images · non-contrast
Comparison: Low-dose lung cancer screening chest CT 04/23/2020.

CLINICAL DATA: 60-year-old male former smoker (quit in 0881) with
78 pack-year history of smoking. Lung cancer screening examination.

EXAM:
CT CHEST WITHOUT CONTRAST LOW-DOSE FOR LUNG CANCER SCREENING
TECHNIQUE: Multidetector CT imaging of the chest was performed following the
standard protocol without IV contrast.

[ct lung segmentation data · axial · 0.80mm/px · z∈[-342,-342]mm · 10 of 345 frames shown]
[frame 1/345  mediastinal]
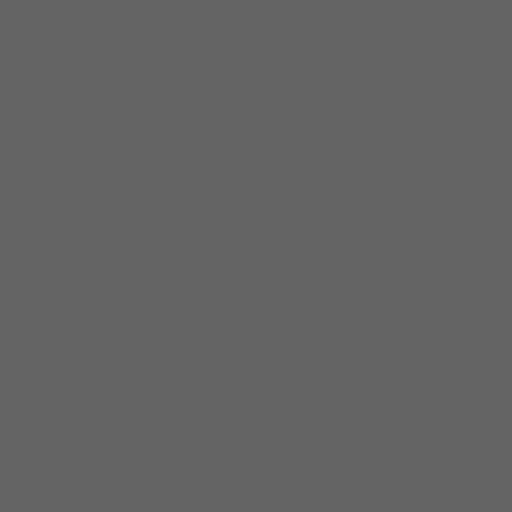
[frame 1/345  lung]
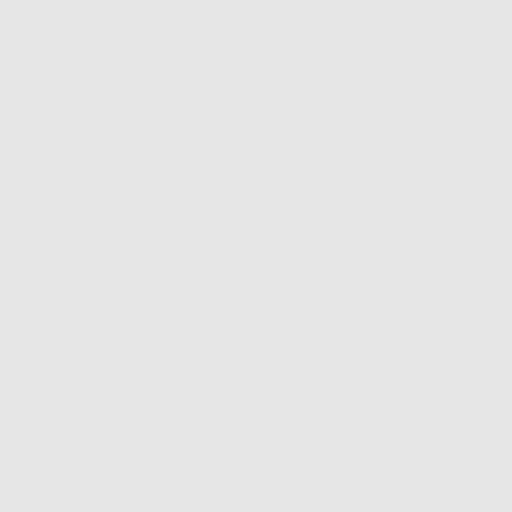
[frame 39/345  lung]
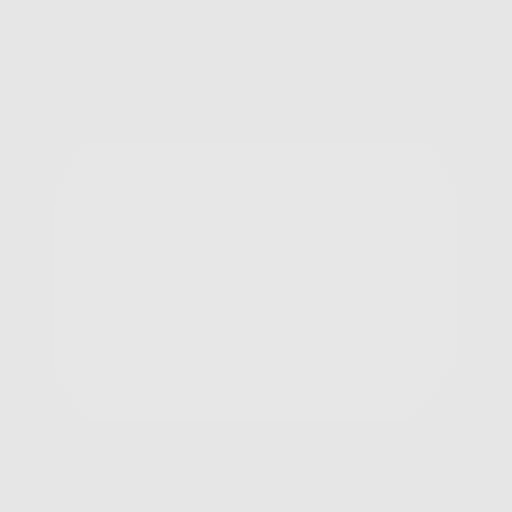
[frame 77/345  lung]
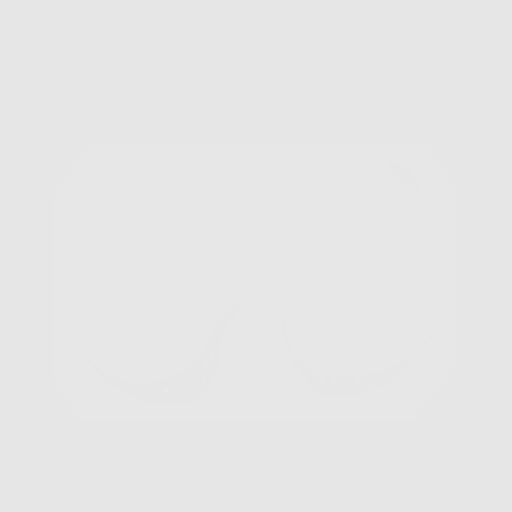
[frame 115/345  lung]
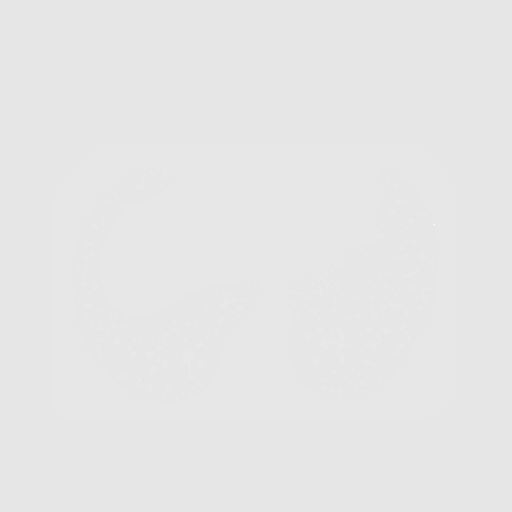
[frame 153/345  mediastinal]
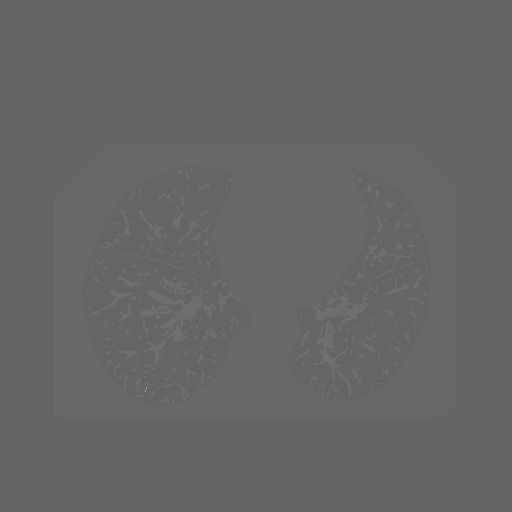
[frame 153/345  lung]
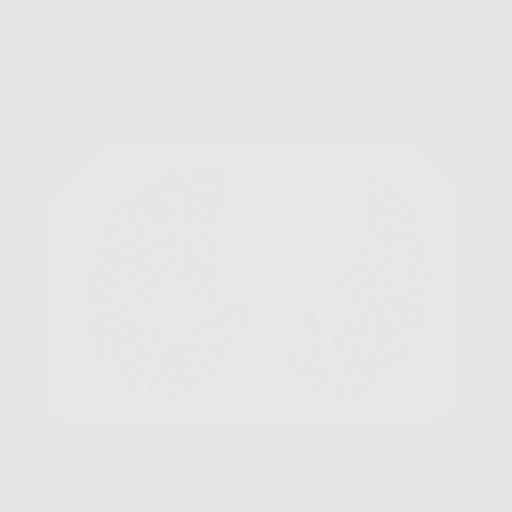
[frame 192/345  lung]
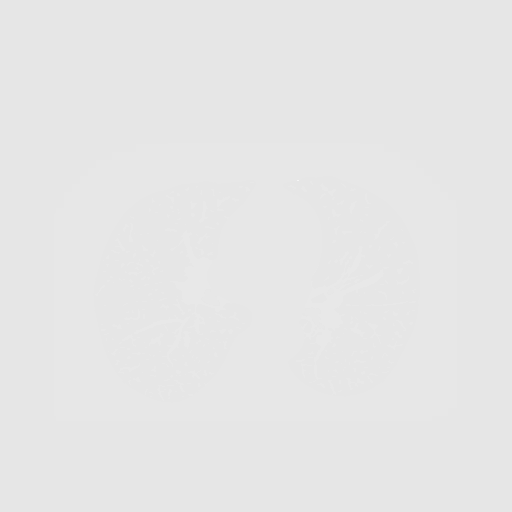
[frame 230/345  lung]
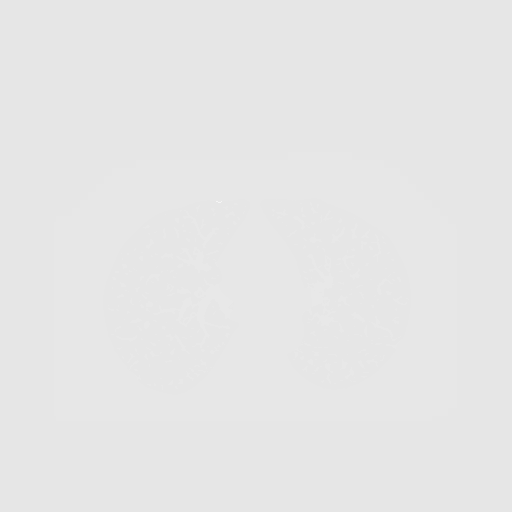
[frame 268/345  lung]
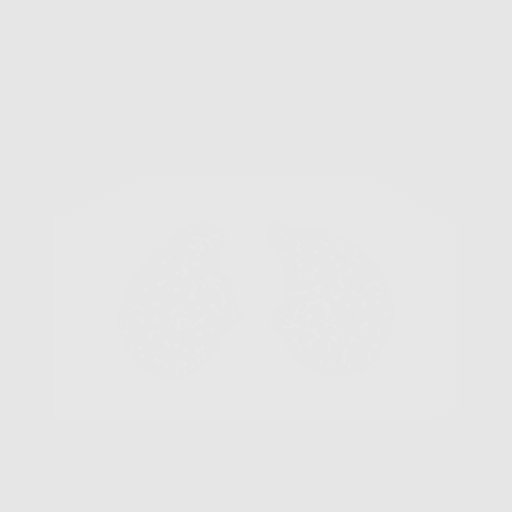
[frame 306/345  mediastinal]
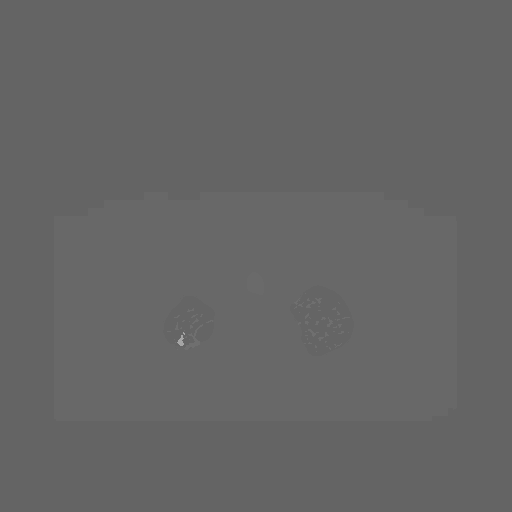
[frame 306/345  lung]
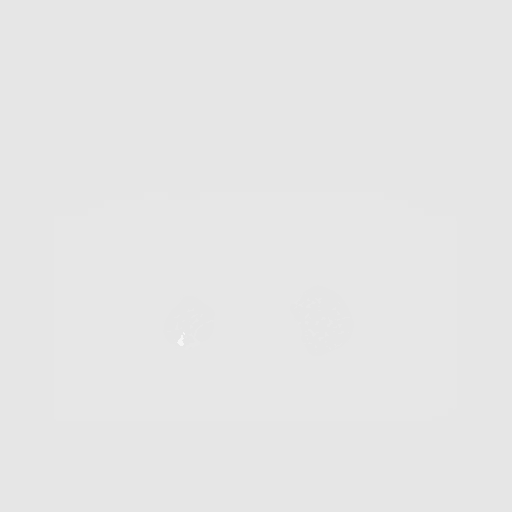
[frame 345/345  lung]
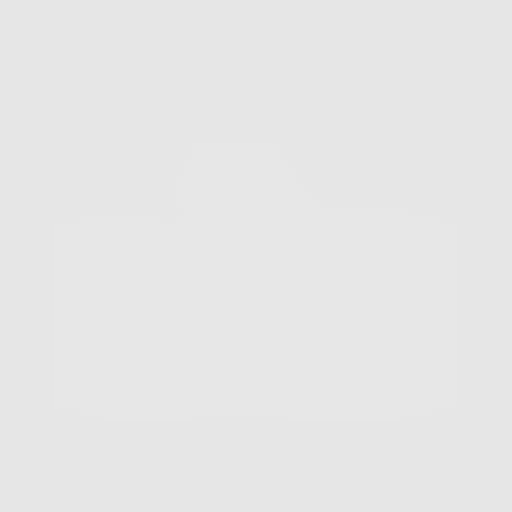

[10 of 10 positions shown; findings below may reference images not displayed]

FINDINGS: Cardiovascular: Heart size is normal. There is no significant
pericardial fluid, thickening or pericardial calcification. There is
aortic atherosclerosis, as well as atherosclerosis of the great
vessels of the mediastinum and the coronary arteries, including
calcified atherosclerotic plaque in the left main and left anterior
descending coronary arteries.

Mediastinum/Nodes: No pathologically enlarged mediastinal or hilar
lymph nodes. Please note that accurate exclusion of hilar adenopathy
is limited on noncontrast CT scans. Esophagus is unremarkable in
appearance. No axillary lymphadenopathy.

Lungs/Pleura: Tiny left upper lobe pulmonary nodule near the apex
(axial image 38 of series 3), with a volume derived mean diameter of
1.7 mm. No larger more suspicious appearing pulmonary nodules or
masses are noted. No acute consolidative airspace disease. No
pleural effusions. Mild diffuse bronchial wall thickening with mild
centrilobular and paraseptal emphysema.

Upper Abdomen: Aortic atherosclerosis.

Musculoskeletal: There are no aggressive appearing lytic or blastic
lesions noted in the visualized portions of the skeleton.
IMPRESSION: 1. Lung-RADS 2S, benign appearance or behavior. Continue annual
screening with low-dose chest CT without contrast in 12 months.
2. The "S" modifier above refers to potentially clinically
significant non lung cancer related findings. Specifically, there is
aortic atherosclerosis, in addition to left main and left anterior
descending coronary artery disease. Please note that although the
presence of coronary artery calcium documents the presence of
coronary artery disease, the severity of this disease and any
potential stenosis cannot be assessed on this non-gated CT
examination. Assessment for potential risk factor modification,
dietary therapy or pharmacologic therapy may be warranted, if
clinically indicated.
3. Mild diffuse bronchial wall thickening with mild centrilobular
and paraseptal emphysema; imaging findings suggestive of underlying
COPD.

Aortic Atherosclerosis (FCKW4-M9Z.Z) and Emphysema (FCKW4-YWN.1).

## 2023-05-02 ENCOUNTER — Ambulatory Visit (HOSPITAL_COMMUNITY)
Admission: RE | Admit: 2023-05-02 | Discharge: 2023-05-02 | Disposition: A | Discharge status: Home / Self Care | Payer: 59 | Source: Ambulatory Visit | Attending: Family Medicine | Admitting: Family Medicine

## 2023-05-02 DIAGNOSIS — Z122 Encounter for screening for malignant neoplasm of respiratory organs: Secondary | ICD-10-CM | POA: Diagnosis present

## 2023-05-02 DIAGNOSIS — Z87891 Personal history of nicotine dependence: Secondary | ICD-10-CM | POA: Diagnosis present

## 2023-05-03 LAB — LIPID PANEL
Chol/HDL Ratio: 2.9 {ratio} (ref 0.0–5.0)
Cholesterol, Total: 157 mg/dL (ref 100–199)
HDL: 55 mg/dL (ref >39)
LDL Chol Calc (NIH): 84 mg/dL (ref 0–99)
Triglycerides: 95 mg/dL (ref 0–149)
VLDL Cholesterol Cal: 18 mg/dL (ref 5–40)

## 2023-05-10 ENCOUNTER — Other Ambulatory Visit: Payer: Self-pay

## 2023-05-10 DIAGNOSIS — E782 Mixed hyperlipidemia: Secondary | ICD-10-CM

## 2023-05-10 MED ORDER — EZETIMIBE 10 MG PO TABS: 10.0000 mg | ORAL_TABLET | Freq: Every day | ORAL | 3 refills | Status: DC

## 2023-07-10 NOTE — Progress Notes (Signed)
 Cardiology Office Note:    Date:  07/11/2023   ID:  Steven Henson, DOB [birth year], MRN [id number]  PCP:  Steven Slater, MD  Cardiologist:  Aleene Passe, MD  Electrophysiologist:  None   Referring MD: Steven Slater, MD   Chief Complaint  Patient presents with   Coronary Artery Disease    History of Present Illness:    Steven Henson is a 63 y.o. male with a hx of nonobstructive CAD, former tobacco use, hyperlipidemia, hypertension who presents for follow-up.   Cath 12/1998 showed 30% ostial LAD stenosis.  Exercise Myoview  on 04/2017 showed normal perfusion, EF 54%, below average exercise tolerance (5.8 METS).  Echocardiogram 05/21/2017 showed EF 55 to 60%, no significant valvular disease.    Since last clinic visit, he reports he is doing well.  Denies any chest pain, dyspnea, lightheadedness, syncope, or palpitations.  Does report some lower extremity edema.  He walks dog daily and plays pickleball 4 to 5 days/week.  BP Readings from Last 3 Encounters:  07/11/23 (!) 140/86  07/13/22 122/74  05/19/21 (!) 150/94     Past Medical History:  Diagnosis Date   Arthritis    lower back   Coronary artery disease    Family history of adverse reaction to anesthesia    my mother had a hard waking up from anesthesia   H/O echocardiogram 12/04/1997   Normal LV systolic function. No valvular abnormalities.   H/O myocardial perfusion scan 05/20/2009   The post stress myocardial perfusion images show a normal pattern of perfusion in all regions. the post stress left ventricle is normal in size. no significant wall motion abnormalities noted. Normal myocardial perfusion study. this is a low risk scan.   History of kidney stones    lots   Hyperlipidemia    Hypertension    Kidney stones    Lipoma    on back   Sleep apnea    no CPAP use - denies apnea, waking up gasping/choking, denies daytime sleepiness    Past Surgical History:  Procedure Laterality Date   BACK SURGERY      Micro surgery at [facility] 2012?   CARDIAC CATHETERIZATION  > 10 yrs.   CYSTOSCOPY/RETROGRADE/URETEROSCOPY/STONE EXTRACTION WITH BASKET  1/02, 12/01   EXTRACORPOREAL SHOCK WAVE LITHOTRIPSY Left 05/17/2019   Procedure: EXTRACORPOREAL SHOCK WAVE LITHOTRIPSY (ESWL);  Surgeon: Steven Lonni Righter, MD;  Location: [facility];  Service: Urology;  Laterality: Left;   EXTRACORPOREAL SHOCK WAVE LITHOTRIPSY Right 03/13/2020   Procedure: EXTRACORPOREAL SHOCK WAVE LITHOTRIPSY (ESWL);  Surgeon: Steven Lonni Righter, MD;  Location: [facility];  Service: Urology;  Laterality: Right;   FRACTURE SURGERY  1974   right leg, both humerus, right wrist   KIDNEY STONE SURGERY  2006   LIPOMA EXCISION  05/14/2011   Procedure: EXCISION LIPOMA;  Surgeon: Steven MALVA Pina III, MD;  Location: [facility];  Service: General;  Laterality: Right;  EXCISION RIGHT UPPER BACK LIPOMA   ORIF FINGER / THUMB FRACTURE  2002   right   SHOULDER ARTHROSCOPY W/ SUBACROMIAL DECOMPRESSION AND DISTAL CLAVICLE EXCISION  7/08   left    Current Medications: Current Meds  Medication Sig   acetaminophen  (TYLENOL ) 500 MG tablet Take 1,000 mg by mouth every 6 (six) hours as needed for moderate pain.   amLODipine  (NORVASC ) 5 MG tablet Take 5 mg by mouth daily.   aspirin  EC 81 MG tablet Take 81 mg by mouth daily.   atorvastatin  (LIPITOR) 80 MG tablet  Take 1 tablet (80 mg total) by mouth daily.   ezetimibe  (ZETIA ) 10 MG tablet Take 1 tablet (10 mg total) by mouth daily.   fish oil-omega-3 fatty acids  1000 MG capsule Take 2,000 mg by mouth 2 (two) times daily.      Allergies:   Ciprofloxacin , Benzoin, Lactated ringers , and Tincture of benzoin [benzoin compound]   Social History   Socioeconomic History   Marital status: Married    Spouse name: Not on file   Number of children: 2   Years of education: Not on file   Highest education level: Not on file  Occupational History   Occupation: Paramedic     Employer: [employer]  Tobacco Use   Smoking status: Former    Types: Cigars    Quit date: 2009    Years since quitting: 16.0   Smokeless tobacco: Never   Tobacco comments:    maybe 2 cigars weekly  Vaping Use   Vaping status: Never Used  Substance and Sexual Activity   Alcohol use: Yes    Alcohol/week: 2.0 standard drinks of alcohol    Types: 2 Shots of liquor per week   Drug use: No   Sexual activity: Yes  Other Topics Concern   Not on file  Social History Narrative   Not on file   Social Drivers of Health   Financial Resource Strain: Not on file  Food Insecurity: Not on file  Transportation Needs: Not on file  Physical Activity: Not on file  Stress: Not on file  Social Connections: Not on file     Family History: The patient's family history includes Anesthesia problems in his mother; COPD in his paternal grandfather; Heart disease in his father, mother, paternal grandfather, paternal grandmother, and sister; Heart failure in his paternal grandfather; Lung cancer in his maternal grandfather; Parkinson's disease in his maternal grandmother.  ROS:   Please see the history of present illness.     All other systems reviewed and are negative.  EKGs/Labs/Other Studies Reviewed:    The following studies were reviewed today:   EKG:   07/13/22: NSR, rate 72, no ST abnormalities 07/11/23: Normal sinus rhythm, rate 64, no ST abnormalities  Recent Labs: No results found for requested labs within last 365 days.  Recent Lipid Panel    Component Value Date/Time   CHOL 157 05/03/2023 0729   TRIG 95 05/03/2023 0729   HDL 55 05/03/2023 0729   CHOLHDL 2.9 05/03/2023 0729   CHOLHDL 3 08/14/2014 0807   VLDL 34.8 08/14/2014 0807   LDLCALC 84 05/03/2023 0729    Physical Exam:    VS:  BP (!) 140/86 (BP Location: Right Arm, Patient Position: Sitting, Cuff Size: Normal)   Pulse 64   Ht 5' 10 (1.778 m)   Wt 215 lb 9.6 oz (97.8 kg)   SpO2 97%   BMI 30.94 kg/m     Wt  Readings from Last 3 Encounters:  07/11/23 215 lb 9.6 oz (97.8 kg)  07/13/22 216 lb (98 kg)  02/16/21 216 lb 9.6 oz (98.2 kg)     GEN:  Well nourished, well developed in no acute distress HEENT: Normal NECK: No JVD; No carotid bruits LYMPHATICS: No lymphadenopathy CARDIAC: RRR, no murmurs, rubs, gallops RESPIRATORY:  Clear to auscultation without rales, wheezing or rhonchi  ABDOMEN: Soft, non-tender, non-distended MUSCULOSKELETAL:  No edema; No deformity  SKIN: Warm and dry NEUROLOGIC:  Alert and oriented x 3 PSYCHIATRIC:  Normal affect   ASSESSMENT:  1. Coronary artery disease involving native coronary artery of native heart without angina pectoris   2. Essential hypertension   3. Hyperlipidemia, unspecified hyperlipidemia type     PLAN:    CAD: Cath 12/1998 showed 30% ostial LAD stenosis.  Exercise Myoview  on 04/2017 showed normal perfusion, EF 54%, below average exercise tolerance (5.8 METS).  Echocardiogram 05/21/2017 showed EF 55 to 60%, no significant valvular disease.  No anginal symptoms. -Continue aspirin  81 mg daily -LDL 84 on 05/03/2023 on atorvastatin  80 mg daily.  Zetia  10 mg daily added.  Check lipid panel  Hypertension: On amlodipine  5 mg daily.  BP mildly elevated in clinic today.  Asked to check BP twice daily for next week and let us  know results  Hyperlipidemia: LDL 84 on 05/03/2023 on atorvastatin  80 mg daily.  Zetia  10 mg daily added.  Check lipid panel  Leg numbness: Normal ABIs 02/18/2021   RTC in 1 year  Medication Adjustments/Labs and Tests Ordered: Current medicines are reviewed at length with the patient today.  Concerns regarding medicines are outlined above.  Orders Placed This Encounter  Procedures   Comprehensive Metabolic Panel (CMET)   CBC w/Diff/Platelet   Lipid panel   EKG 12-Lead   No orders of the defined types were placed in this encounter.    Patient Instructions  Medication Instructions:  Continue current medications *If  you need a refill on your cardiac medications before your next appointment, please call your pharmacy*   Lab Work: Cmet, Lipid panel, CBC Today If you have labs (blood work) drawn today and your tests are completely normal, you will receive your results only by: MyChart Message (if you have MyChart) OR A paper copy in the mail If you have any lab test that is abnormal or we need to change your treatment, we will call you to review the results.   Testing/Procedures: none   Follow-Up: At [facility], you and your health needs are our priority.  As part of our continuing mission to provide you with exceptional heart care, we have created designated Provider Care Teams.  These Care Teams include your primary Cardiologist (physician) and Advanced Practice Providers (APPs -  Physician Assistants and Nurse Practitioners) who all work together to provide you with the care you need, when you need it.  We recommend signing up for the patient portal called MyChart.  Sign up information is provided on this After Visit Summary.  MyChart is used to connect with patients for Virtual Visits (Telemedicine).  Patients are able to view lab/test results, encounter notes, upcoming appointments, etc.  Non-urgent messages can be sent to your provider as well.   To learn more about what you can do with MyChart, go to [email or web address].    Your next appointment:   1 year(s)  Provider:   Dr.Rapheal Masso  Other Instructions Please check blood pressure twice a day for one week  and send via my chart         Signed, Lonni LITTIE Nanas, MD  07/11/2023 5:49 PM    [facility]

## 2023-07-11 ENCOUNTER — Encounter: Payer: Self-pay | Admitting: Cardiology

## 2023-07-11 ENCOUNTER — Ambulatory Visit: Payer: Medicare Other | Attending: Cardiology | Admitting: Cardiology

## 2023-07-11 VITALS — BP 140/86 | HR 64 | Ht 70.0 in | Wt 215.6 lb

## 2023-07-11 DIAGNOSIS — I10 Essential (primary) hypertension: Secondary | ICD-10-CM | POA: Diagnosis not present

## 2023-07-11 DIAGNOSIS — E785 Hyperlipidemia, unspecified: Secondary | ICD-10-CM

## 2023-07-11 DIAGNOSIS — I2510 Atherosclerotic heart disease of native coronary artery without angina pectoris: Secondary | ICD-10-CM

## 2023-07-11 LAB — LIPID PANEL

## 2023-07-11 NOTE — Patient Instructions (Signed)
 Medication Instructions:  Continue current medications *If you need a refill on your cardiac medications before your next appointment, please call your pharmacy*   Lab Work: Cmet, Lipid panel, CBC Today If you have labs (blood work) drawn today and your tests are completely normal, you will receive your results only by: MyChart Message (if you have MyChart) OR A paper copy in the mail If you have any lab test that is abnormal or we need to change your treatment, we will call you to review the results.   Testing/Procedures: none   Follow-Up: At [facility], you and your health needs are our priority.  As part of our continuing mission to provide you with exceptional heart care, we have created designated Provider Care Teams.  These Care Teams include your primary Cardiologist (physician) and Advanced Practice Providers (APPs -  Physician Assistants and Nurse Practitioners) who all work together to provide you with the care you need, when you need it.  We recommend signing up for the patient portal called MyChart.  Sign up information is provided on this After Visit Summary.  MyChart is used to connect with patients for Virtual Visits (Telemedicine).  Patients are able to view lab/test results, encounter notes, upcoming appointments, etc.  Non-urgent messages can be sent to your provider as well.   To learn more about what you can do with MyChart, go to [email or web address].    Your next appointment:   1 year(s)  Provider:   Dr.Schumann  Other Instructions Please check blood pressure twice a day for one week  and send via my chart

## 2023-07-12 LAB — LIPID PANEL
Cholesterol, Total: 157 mg/dL (ref 100–199)
HDL: 54 mg/dL (ref >39)
LDL CALC COMMENT:: 2.9 ratio (ref 0.0–5.0)
LDL Chol Calc (NIH): 74 mg/dL (ref 0–99)
Triglycerides: 173 mg/dL — ABNORMAL HIGH (ref 0–149)
VLDL Cholesterol Cal: 29 mg/dL (ref 5–40)

## 2023-07-12 LAB — CBC WITH DIFFERENTIAL/PLATELET
Basophils Absolute: 0.1 10*3/uL (ref 0.0–0.2)
Basos: 1 %
EOS (ABSOLUTE): 0.2 10*3/uL (ref 0.0–0.4)
Eos: 3 %
Hematocrit: 45.4 % (ref 37.5–51.0)
Hemoglobin: 15.1 g/dL (ref 13.0–17.7)
Immature Grans (Abs): 0 10*3/uL (ref 0.0–0.1)
Immature Granulocytes: 0 %
Lymphocytes Absolute: 2.5 10*3/uL (ref 0.7–3.1)
Lymphs: 31 %
MCH: 29.2 pg (ref 26.6–33.0)
MCHC: 33.3 g/dL (ref 31.5–35.7)
MCV: 88 fL (ref 79–97)
Monocytes Absolute: 0.8 10*3/uL (ref 0.1–0.9)
Monocytes: 11 %
Neutrophils Absolute: 4.3 10*3/uL (ref 1.4–7.0)
Neutrophils: 54 %
Platelets: 223 10*3/uL (ref 150–450)
RBC: 5.18 x10E6/uL (ref 4.14–5.80)
RDW: 13 % (ref 11.6–15.4)
WBC: 7.9 10*3/uL (ref 3.4–10.8)

## 2023-07-12 LAB — COMPREHENSIVE METABOLIC PANEL
ALT: 39 IU/L (ref 0–44)
AST: 27 IU/L (ref 0–40)
Albumin: 4.4 g/dL (ref 3.9–4.9)
Alkaline Phosphatase: 99 IU/L (ref 44–121)
BUN/Creatinine Ratio: 22 (ref 10–24)
BUN: 18 mg/dL (ref 8–27)
Bilirubin Total: 0.5 mg/dL (ref 0.0–1.2)
CO2: 23 mmol/L (ref 20–29)
Calcium: 9.4 mg/dL (ref 8.6–10.2)
Chloride: 106 mmol/L (ref 96–106)
Creatinine, Ser: 0.83 mg/dL (ref 0.76–1.27)
Globulin, Total: 2.1 g/dL (ref 1.5–4.5)
Glucose: 89 mg/dL (ref 70–99)
Potassium: 4 mmol/L (ref 3.5–5.2)
Sodium: 142 mmol/L (ref 134–144)
Total Protein: 6.5 g/dL (ref 6.0–8.5)
eGFR: 98 mL/min/{1.73_m2} (ref >59)

## 2023-07-20 ENCOUNTER — Encounter: Payer: Self-pay | Admitting: Cardiology

## 2023-07-22 ENCOUNTER — Encounter: Payer: Self-pay | Admitting: Cardiology

## 2023-07-22 MED ORDER — AMLODIPINE BESYLATE 10 MG PO TABS: 10.0000 mg | ORAL_TABLET | Freq: Every day | ORAL | 3 refills | Status: DC

## 2023-07-22 NOTE — Telephone Encounter (Signed)
BP remains elevated, would increase amlodipine to 10 mg daily.  Asked to check BP daily for next 2 weeks and let us know results

## 2023-07-22 NOTE — Telephone Encounter (Signed)
Called and spoke to patient and per Dr. Bjorn Pippin BP remains elevated. Recommended  increase amlodipine to 10 mg daily. Made patient aware to check BP daily for next 2 weeks and send results via my chart. Patient verbalized an understanding.

## 2023-07-25 MED ORDER — AMLODIPINE BESYLATE 10 MG PO TABS: 10.0000 mg | ORAL_TABLET | Freq: Every day | ORAL | 3 refills | Status: DC

## 2023-08-23 ENCOUNTER — Encounter: Payer: Self-pay | Admitting: Cardiology

## 2023-08-24 NOTE — Telephone Encounter (Signed)
 Amlodipine is not typically associated with ED but could potentially be contributing.  We do not typically prescribe ED medications, would recommend PCP follow-up

## 2023-09-12 ENCOUNTER — Other Ambulatory Visit: Payer: Self-pay | Admitting: Cardiology

## 2023-12-14 ENCOUNTER — Encounter (HOSPITAL_BASED_OUTPATIENT_CLINIC_OR_DEPARTMENT_OTHER): Payer: Self-pay | Admitting: Cardiology

## 2023-12-15 NOTE — Telephone Encounter (Signed)
 Can we schedule him in pharmacy lipid clinic?

## 2023-12-16 ENCOUNTER — Other Ambulatory Visit: Payer: Self-pay | Admitting: *Deleted

## 2023-12-16 DIAGNOSIS — E785 Hyperlipidemia, unspecified: Secondary | ICD-10-CM

## 2023-12-16 NOTE — Progress Notes (Signed)
 Called and left message with Dr. Alda Amas recommendations, referral to Pharm D. Left message for patient to call office for any questions.

## 2024-02-01 ENCOUNTER — Ambulatory Visit: Admitting: Pharmacist Clinician (PhC)/ Clinical Pharmacy Specialist

## 2024-02-22 ENCOUNTER — Telehealth: Payer: Self-pay

## 2024-02-22 NOTE — Telephone Encounter (Signed)
 Returned pt call. Pt's quit date has aged him out of the LCS program. Pt verbalized understanding & nothing further needed at this time

## 2024-03-13 ENCOUNTER — Other Ambulatory Visit: Payer: Self-pay | Admitting: Cardiology

## 2024-03-14 ENCOUNTER — Encounter: Payer: Self-pay | Admitting: Cardiology

## 2024-03-15 NOTE — Telephone Encounter (Signed)
Yes we can place referral.

## 2024-03-19 ENCOUNTER — Other Ambulatory Visit: Payer: Self-pay | Admitting: *Deleted

## 2024-03-19 DIAGNOSIS — I10 Essential (primary) hypertension: Secondary | ICD-10-CM

## 2024-03-19 DIAGNOSIS — E782 Mixed hyperlipidemia: Secondary | ICD-10-CM

## 2024-03-19 DIAGNOSIS — I2510 Atherosclerotic heart disease of native coronary artery without angina pectoris: Secondary | ICD-10-CM

## 2024-03-19 DIAGNOSIS — E785 Hyperlipidemia, unspecified: Secondary | ICD-10-CM

## 2024-05-26 ENCOUNTER — Other Ambulatory Visit: Payer: Self-pay | Admitting: Cardiology

## 2024-06-06 ENCOUNTER — Other Ambulatory Visit: Payer: Self-pay | Admitting: Cardiology

## 2024-06-26 ENCOUNTER — Encounter: Payer: Self-pay | Admitting: Cardiology
